# Patient Record
Sex: Female | Born: 2000 | Race: Black or African American | Hispanic: No | Marital: Single | Smoking: Former smoker
Health system: Southern US, Community
[De-identification: ages and names within clinical notes are randomized; demographics above are authoritative.]

## PROBLEM LIST (undated history)

## (undated) DIAGNOSIS — F419 Anxiety disorder, unspecified: Secondary | ICD-10-CM

## (undated) DIAGNOSIS — J45909 Unspecified asthma, uncomplicated: Secondary | ICD-10-CM

## (undated) DIAGNOSIS — T7840XA Allergy, unspecified, initial encounter: Secondary | ICD-10-CM

## (undated) DIAGNOSIS — K219 Gastro-esophageal reflux disease without esophagitis: Secondary | ICD-10-CM

## (undated) DIAGNOSIS — F319 Bipolar disorder, unspecified: Secondary | ICD-10-CM

## (undated) DIAGNOSIS — F32A Depression, unspecified: Secondary | ICD-10-CM

## (undated) DIAGNOSIS — E669 Obesity, unspecified: Secondary | ICD-10-CM

## (undated) DIAGNOSIS — D649 Anemia, unspecified: Secondary | ICD-10-CM

## (undated) HISTORY — DX: Allergy, unspecified, initial encounter: T78.40XA

## (undated) HISTORY — PX: MANDIBLE SURGERY: SHX707

## (undated) HISTORY — DX: Obesity, unspecified: E66.9

## (undated) HISTORY — DX: Gastro-esophageal reflux disease without esophagitis: K21.9

## (undated) HISTORY — DX: Bipolar disorder, unspecified: F31.9

## (undated) HISTORY — DX: Unspecified asthma, uncomplicated: J45.909

## (undated) HISTORY — DX: Anxiety disorder, unspecified: F41.9

## (undated) HISTORY — DX: Anemia, unspecified: D64.9

## (undated) HISTORY — DX: Depression, unspecified: F32.A

---
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
## 2018-03-18 DIAGNOSIS — J4520 Mild intermittent asthma, uncomplicated: Secondary | ICD-10-CM | POA: Insufficient documentation

## 2018-03-18 DIAGNOSIS — F333 Major depressive disorder, recurrent, severe with psychotic symptoms: Secondary | ICD-10-CM | POA: Insufficient documentation

## 2018-05-30 DIAGNOSIS — F418 Other specified anxiety disorders: Secondary | ICD-10-CM | POA: Insufficient documentation

## 2018-09-19 DIAGNOSIS — O99820 Streptococcus B carrier state complicating pregnancy: Secondary | ICD-10-CM | POA: Insufficient documentation

## 2018-10-15 DIAGNOSIS — R12 Heartburn: Secondary | ICD-10-CM | POA: Insufficient documentation

## 2019-01-31 ENCOUNTER — Other Ambulatory Visit: Payer: Self-pay

## 2019-01-31 ENCOUNTER — Encounter: Payer: Self-pay | Admitting: Emergency Medicine

## 2019-01-31 ENCOUNTER — Emergency Department
Admission: EM | Admit: 2019-01-31 | Discharge: 2019-01-31 | Disposition: A | Discharge status: Home / Self Care | Payer: Medicaid Other | Attending: Emergency Medicine | Admitting: Emergency Medicine

## 2019-01-31 ENCOUNTER — Emergency Department: Payer: Medicaid Other

## 2019-01-31 DIAGNOSIS — Z87891 Personal history of nicotine dependence: Secondary | ICD-10-CM | POA: Insufficient documentation

## 2019-01-31 DIAGNOSIS — R42 Dizziness and giddiness: Secondary | ICD-10-CM

## 2019-01-31 DIAGNOSIS — M542 Cervicalgia: Secondary | ICD-10-CM | POA: Diagnosis not present

## 2019-01-31 LAB — CBC
HCT: 39.4 % (ref 36.0–46.0)
Hemoglobin: 11.9 g/dL — ABNORMAL LOW (ref 12.0–15.0)
MCH: 23.1 pg — ABNORMAL LOW (ref 26.0–34.0)
MCHC: 30.2 g/dL (ref 30.0–36.0)
MCV: 76.5 fL — ABNORMAL LOW (ref 80.0–100.0)
Platelets: 354 10*3/uL (ref 150–400)
RBC: 5.15 MIL/uL — ABNORMAL HIGH (ref 3.87–5.11)
RDW: 13.5 % (ref 11.5–15.5)
WBC: 5.4 10*3/uL (ref 4.0–10.5)
nRBC: 0 % (ref 0.0–0.2)

## 2019-01-31 LAB — COMPREHENSIVE METABOLIC PANEL
ALT: 26 U/L (ref 0–44)
AST: 24 U/L (ref 15–41)
Albumin: 4.2 g/dL (ref 3.5–5.0)
Alkaline Phosphatase: 105 U/L (ref 38–126)
Anion gap: 7 (ref 5–15)
BUN: 9 mg/dL (ref 6–20)
CO2: 24 mmol/L (ref 22–32)
Calcium: 9.8 mg/dL (ref 8.9–10.3)
Chloride: 108 mmol/L (ref 98–111)
Creatinine, Ser: 0.66 mg/dL (ref 0.44–1.00)
GFR calc Af Amer: >60 mL/min (ref >60)
GFR calc non Af Amer: >60 mL/min (ref >60)
Glucose, Bld: 108 mg/dL — ABNORMAL HIGH (ref 70–99)
Potassium: 3.7 mmol/L (ref 3.5–5.1)
Sodium: 139 mmol/L (ref 135–145)
Total Bilirubin: 0.6 mg/dL (ref 0.3–1.2)
Total Protein: 7.3 g/dL (ref 6.5–8.1)

## 2019-01-31 MED ORDER — MELOXICAM 15 MG PO TABS: 15.0000 mg | ORAL_TABLET | Freq: Every day | ORAL | 0 refills | Status: DC

## 2019-01-31 NOTE — ED Notes (Signed)
Pt verbalized understanding of discharge instructions. NAD at this time. 

## 2019-01-31 NOTE — ED Provider Notes (Signed)
[facility] Emergency Department Provider Note ____________________________________________   First MD Initiated Contact with Patient 01/31/19 1616     (approximate)  I have reviewed the triage vital signs and the nursing notes.   HISTORY  Chief Complaint Dizziness and Neck Pain  HPI Brittney Kline is a 18 y.o. female who presents the emergency department for treatment and evaluation of intermittent dizziness that has been present for several months.  She has not experienced any near syncope/syncopal episodes.  Her father is a diabetic and she thought maybe she to is a diabetic.  Additionally, she states that she has pain in her neck when she rotates it around. She denies injury.  Symptoms have been present for "a long time."  She states that she can sometimes hear a pop.  This is not necessarily painful every time she moves, but occasionally she can feel a pain in her neck and it radiates down into her shoulders.  No alleviating measures attempted prior to arrival.  No previous exam         History reviewed. No pertinent past medical history.  There are no active problems to display for this patient.   History reviewed. No pertinent surgical history.  Prior to Admission medications   Medication Sig Start Date End Date Taking? Authorizing Provider  meloxicam (MOBIC) 15 MG tablet Take 1 tablet (15 mg total) by mouth daily. 01/31/19   Victorino Dike, FNP    Allergies Patient has no known allergies.  No family history on file.  Social History Social History   Tobacco Use  . Smoking status: Former Research scientist (life sciences)  . Smokeless tobacco: Never Used  Substance Use Topics  . Alcohol use: Not Currently  . Drug use: Not Currently    Review of Systems  Constitutional: No fever/chills.  Positive for intermittent dizziness Eyes: No visual changes. ENT: No sore throat. Cardiovascular: Denies chest pain. Respiratory: Denies shortness of breath.  Gastrointestinal: No abdominal pain.  No nausea, no vomiting.  No diarrhea.  No constipation. Genitourinary: Negative for dysuria. Musculoskeletal: Negative for back pain.  Positive for neck pain Skin: Negative for rash. Neurological: Negative for headaches, focal weakness or numbness. ____________________________________________   PHYSICAL EXAM:  VITAL SIGNS: ED Triage Vitals  Enc Vitals Group     BP 01/31/19 1445 124/71     Pulse Rate 01/31/19 1445 (!) 55     Resp 01/31/19 1445 18     Temp 01/31/19 1445 98.6 F (37 C)     Temp src --      SpO2 01/31/19 1445 100 %     Weight 01/31/19 1446 200 lb (90.7 kg)     Height 01/31/19 1446 5\' 3"  (1.6 m)     Head Circumference --      Peak Flow --      Pain Score 01/31/19 1446 8     Pain Loc --      Pain Edu? --      Excl. in [place]? --     Constitutional: Alert and oriented. Well appearing and in no acute distress. Eyes: Conjunctivae are normal. PERRL. EOMI. Head: Atraumatic. Nose: No congestion/rhinnorhea. Mouth/Throat: Mucous membranes are moist.  Oropharynx non-erythematous. Neck: No stridor.   Hematological/Lymphatic/Immunilogical: No cervical lymphadenopathy. Cardiovascular: Normal rate, regular rhythm. Grossly normal heart sounds.  Good peripheral circulation. Respiratory: Normal respiratory effort.  No retractions. Lungs CTAB. Gastrointestinal: Soft and nontender. No distention. No abdominal bruits. No CVA tenderness. Genitourinary:  Musculoskeletal: No lower extremity tenderness nor edema.  No joint effusions. Neurologic:  Normal speech and language. No gross focal neurologic deficits are appreciated. No gait instability. Skin:  Skin is warm, dry and intact. No rash noted. Psychiatric: Mood and affect are normal. Speech and behavior are normal.  ____________________________________________   LABS (all labs ordered are listed, but only abnormal results are displayed)  Labs Reviewed  COMPREHENSIVE METABOLIC PANEL -  Abnormal; Notable for the following components:      Result Value   Glucose, Bld 108 (*)    All other components within normal limits  CBC - Abnormal; Notable for the following components:   RBC 5.15 (*)    Hemoglobin 11.9 (*)    MCV 76.5 (*)    MCH 23.1 (*)    All other components within normal limits   ____________________________________________  EKG  Not indicated ____________________________________________  RADIOLOGY  ED MD interpretation:    Image of the cervical spine is negative for acute findings per radiology.  Official radiology report(s): Dg Cervical Spine 2-3 Views  Result Date: 01/31/2019 CLINICAL DATA:  Neck pain EXAM: CERVICAL SPINE - 2-3 VIEW COMPARISON:  None. IMPRESSION: Negative cervical spine radiographs. Electronically Signed   By: Charlett NoseKevin  Dover M.D.   On: 01/31/2019 17:13    ____________________________________________   PROCEDURES  Procedure(s) performed (including Critical Care):  Procedures  ____________________________________________   INITIAL IMPRESSION / ASSESSMENT AND PLAN     18 year old female presenting to the emergency department for treatment and evaluation of dizziness and neck pain.  Both are chronic in nature.  She states that the dizziness seems to have worsened over the past 24 hours as well as the neck pain.  ED COURSE  Image of the cervical spine is reassuring.  Lab studies are reassuring as well.  The patient felt reassured by knowing that she is not diabetic.  She states that that was her main concern.  Her neck pain will be treated with meloxicam.  She was advised that if this does not help, she is to follow-up with primary care.  She is to return to the emergency department for symptoms change or worsen or for new concerns if unable to schedule an appointment. ____________________________________________   FINAL CLINICAL IMPRESSION(S) / ED DIAGNOSES  Final diagnoses:  Neck pain  Dizziness     ED Discharge  Orders         Ordered    meloxicam (MOBIC) 15 MG tablet  Daily     01/31/19 1725           Note:  This document was prepared using Dragon voice recognition software and may include unintentional dictation errors.   Chinita Pesterriplett, Rodricus Candelaria B, FNP 01/31/19 1739    Shaune PollackIsaacs, [place], MD 02/01/19 1108

## 2019-01-31 NOTE — ED Notes (Signed)
Pt c/o neck pain, nausea. Pt often feels dizzy. Pt able to walk to room.

## 2019-01-31 NOTE — Discharge Instructions (Signed)
Please take your medication as prescribed.  Follow-up with a primary care provider of your choice if not improving with medication and time.  Return to the emergency department for symptoms of change or worsen if unable to schedule an appointment.

## 2019-01-31 NOTE — ED Triage Notes (Signed)
Pt to ED with c/o of neck pain and dizziness that started 2 days ago. Pt can move neck w/o difficulty but states it "cracks".

## 2019-04-21 DIAGNOSIS — F32A Depression, unspecified: Secondary | ICD-10-CM | POA: Insufficient documentation

## 2019-05-25 ENCOUNTER — Emergency Department: Payer: Medicaid Other

## 2019-05-25 ENCOUNTER — Emergency Department
Admission: EM | Admit: 2019-05-25 | Discharge: 2019-05-25 | Disposition: A | Discharge status: Home / Self Care | Payer: Medicaid Other | Attending: Student in an Organized Health Care Education/Training Program | Admitting: Student in an Organized Health Care Education/Training Program

## 2019-05-25 ENCOUNTER — Other Ambulatory Visit: Payer: Self-pay

## 2019-05-25 DIAGNOSIS — R0789 Other chest pain: Secondary | ICD-10-CM | POA: Diagnosis not present

## 2019-05-25 DIAGNOSIS — Z87891 Personal history of nicotine dependence: Secondary | ICD-10-CM | POA: Insufficient documentation

## 2019-05-25 DIAGNOSIS — M791 Myalgia, unspecified site: Secondary | ICD-10-CM | POA: Insufficient documentation

## 2019-05-25 DIAGNOSIS — R52 Pain, unspecified: Secondary | ICD-10-CM

## 2019-05-25 LAB — BASIC METABOLIC PANEL
Anion gap: 8 (ref 5–15)
BUN: 11 mg/dL (ref 6–20)
CO2: 26 mmol/L (ref 22–32)
Calcium: 9.7 mg/dL (ref 8.9–10.3)
Chloride: 103 mmol/L (ref 98–111)
Creatinine, Ser: 0.69 mg/dL (ref 0.44–1.00)
GFR calc Af Amer: >60 mL/min (ref >60)
GFR calc non Af Amer: >60 mL/min (ref >60)
Glucose, Bld: 82 mg/dL (ref 70–99)
Potassium: 3.5 mmol/L (ref 3.5–5.1)
Sodium: 137 mmol/L (ref 135–145)

## 2019-05-25 LAB — CBC
HCT: 41.1 % (ref 36.0–46.0)
Hemoglobin: 12.6 g/dL (ref 12.0–15.0)
MCH: 23.4 pg — ABNORMAL LOW (ref 26.0–34.0)
MCHC: 30.7 g/dL (ref 30.0–36.0)
MCV: 76.4 fL — ABNORMAL LOW (ref 80.0–100.0)
Platelets: 377 10*3/uL (ref 150–400)
RBC: 5.38 MIL/uL — ABNORMAL HIGH (ref 3.87–5.11)
RDW: 14.7 % (ref 11.5–15.5)
WBC: 10.3 10*3/uL (ref 4.0–10.5)
nRBC: 0 % (ref 0.0–0.2)

## 2019-05-25 LAB — POCT PREGNANCY, URINE: Preg Test, Ur: NEGATIVE

## 2019-05-25 LAB — TROPONIN I (HIGH SENSITIVITY)
Troponin I (High Sensitivity): 2 ng/L (ref <18)
Troponin I (High Sensitivity): <2 ng/L (ref <18)

## 2019-05-25 LAB — FIBRIN DERIVATIVES D-DIMER (ARMC ONLY): Fibrin derivatives D-dimer (ARMC): 454.91 ng/mL (FEU) (ref 0.00–499.00)

## 2019-05-25 MED ORDER — SODIUM CHLORIDE 0.9% FLUSH: 3.0000 mL | Freq: Once | INTRAVENOUS | Status: DC

## 2019-05-25 MED ORDER — NAPROXEN 500 MG PO TABS
500.0000 mg | ORAL_TABLET | Freq: Once | ORAL | Status: AC
  Administered 2019-05-25: 500 mg via ORAL
  Filled 2019-05-25: qty 1

## 2019-05-25 NOTE — ED Provider Notes (Signed)
[facility] Emergency Department Provider Note    First MD Initiated Contact with Patient 05/25/19 1309     (approximate)  I have reviewed the triage vital signs and the nursing notes.   HISTORY  Chief Complaint Headache and Generalized Body Aches    HPI Brittney Kline is a 18 y.o. female   the below listed past medical history presents to the ER for evaluation of several months of muscle aches as well as some shortness of breath pleuritic chest pain and recurrent daily headache.  Patient states she is not had any fevers.  No cough or congestion.  Does have a history of asthma.  Has taken albuterol at home without any change in symptoms.  States she will sometimes have some heartburn symptoms after eating.  Denies any abdominal pain right now.  Does not have a PCP that is why she presented to the ER today.  States she did go off of her Zoloft, Zyprexa, trazodone after delivering her baby 7 months ago and restarted on roughly 3 months ago.  Denies any SI or HI.   History reviewed. No pertinent past medical history. No family history on file. Past Surgical History:  Procedure Laterality Date  . VAGINAL DELIVERY     There are no problems to display for this patient.     Prior to Admission medications   Not on File    Allergies Patient has no known allergies.    Social History Social History   Tobacco Use  . Smoking status: Former Games developer  . Smokeless tobacco: Never Used  Substance Use Topics  . Alcohol use: Not Currently  . Drug use: Not Currently    Review of Systems Patient denies headaches, rhinorrhea, blurry vision, numbness, shortness of breath, chest pain, edema, cough, abdominal pain, nausea, vomiting, diarrhea, dysuria, fevers, rashes or hallucinations unless otherwise stated above in HPI. ____________________________________________   PHYSICAL EXAM:  VITAL SIGNS: Vitals:   05/25/19 1127 05/25/19 1340  BP: 133/79 130/70    Pulse: (!) 50 (!) 57  Resp: 18 16  Temp: 98.5 F (36.9 C)   SpO2: 100% 99%    Constitutional: Alert and oriented. pleasant  Eyes: Conjunctivae are normal.  Head: Atraumatic. Nose: No congestion/rhinnorhea. Mouth/Throat: Mucous membranes are moist.   Neck: No stridor. Painless ROM.  Cardiovascular: Normal rate, regular rhythm. Grossly normal heart sounds.  Good peripheral circulation. Respiratory: Normal respiratory effort.  No retractions. Lungs CTAB. Gastrointestinal: Soft and nontender. No distention. No CVA tenderness. Genitourinary:  Musculoskeletal: No lower extremity tenderness nor edema.  Neurologic:  Normal speech and language. No gross focal neurologic deficits are appreciated. No facial droop Skin:  Skin is warm, dry and intact. No rash noted. Psychiatric: Mood and affect are normal. Speech and behavior are normal.  ____________________________________________   LABS (all labs ordered are listed, but only abnormal results are displayed)  Results for orders placed or performed during the hospital encounter of 05/25/19 (from the past 24 hour(s))  Basic metabolic panel     Status: None   Collection Time: 05/25/19 11:39 AM  Result Value Ref Range   Sodium 137 135 - 145 mmol/L   Potassium 3.5 3.5 - 5.1 mmol/L   Chloride 103 98 - 111 mmol/L   CO2 26 22 - 32 mmol/L   Glucose, Bld 82 70 - 99 mg/dL   BUN 11 6 - 20 mg/dL   Creatinine, Ser 5.62 0.44 - 1.00 mg/dL   Calcium 9.7 8.9 - 13.0 mg/dL  GFR calc non Af Amer >60 >60 mL/min   GFR calc Af Amer >60 >60 mL/min   Anion gap 8 5 - 15  CBC     Status: Abnormal   Collection Time: 05/25/19 11:39 AM  Result Value Ref Range   WBC 10.3 4.0 - 10.5 K/uL   RBC 5.38 (H) 3.87 - 5.11 MIL/uL   Hemoglobin 12.6 12.0 - 15.0 g/dL   HCT 16.141.1 09.636.0 - 04.546.0 %   MCV 76.4 (L) 80.0 - 100.0 fL   MCH 23.4 (L) 26.0 - 34.0 pg   MCHC 30.7 30.0 - 36.0 g/dL   RDW 40.914.7 81.111.5 - 91.415.5 %   Platelets 377 150 - 400 K/uL   nRBC 0.0 0.0 - 0.2 %   Troponin I (High Sensitivity)     Status: None   Collection Time: 05/25/19 11:39 AM  Result Value Ref Range   Troponin I (High Sensitivity) 2 <18 ng/L  Pregnancy, urine POC     Status: None   Collection Time: 05/25/19 12:14 PM  Result Value Ref Range   Preg Test, Ur NEGATIVE NEGATIVE  Troponin I (High Sensitivity)     Status: None   Collection Time: 05/25/19  1:36 PM  Result Value Ref Range   Troponin I (High Sensitivity) <2 <18 ng/L  Fibrin derivatives D-Dimer ([facility] only)     Status: None   Collection Time: 05/25/19  1:36 PM  Result Value Ref Range   Fibrin derivatives D-dimer ([facility]) 454.91 0.00 - 499.00 ng/mL (FEU)   ____________________________________________  EKG My review and personal interpretation at Time: 11:34   Indication: chest pain  Rate: 70  Rhythm: sinus dysrhythmia Axis:  normal Other: normal intervals, no stemi, no depression ____________________________________________  RADIOLOGY  I personally reviewed all radiographic images ordered to evaluate for the above acute complaints and reviewed radiology reports and findings.  These findings were personally discussed with the patient.  Please see medical record for radiology report.  ____________________________________________   PROCEDURES  Procedure(s) performed:  Procedures    Critical Care performed: no ____________________________________________   INITIAL IMPRESSION / ASSESSMENT AND PLAN / ED COURSE  Pertinent labs & imaging results that were available during my care of the patient were reviewed by me and considered in my medical decision making (see chart for details).   DDX: acs, pe, bronchitis, covid 19,deconditioning, pna, ptx, cardiomyopathy  Brittney Kline is a 18 y.o. who presents to the ED with symptoms as described above for the past several months.  Patient exceedingly well-appearing clinically.  No hypoxia.  Not consistent with ACS given normal EKG low risk heart score and negative  troponin after several weeks to months of symptoms.  As she is on birth control and post partum will send D-dimer to further restratify for PE.  Although she has no lower extremity swelling.  Exam reassuring.  Denies any recent covid exposures or sick contacts.   Clinical Course as of May 25 1427  Tue May 25, 2019  1427 Blood work reassuring.  Not consistent with PE or dissection.  Her exam remains reassuring.  She is well-appearing.  At this point do believe she stable and appropriate for outpatient work-up.  Has been given referral to PCP. Have discussed with the patient and available family all diagnostics and treatments performed thus far and all questions were answered to the best of my ability. The patient demonstrates understanding and agreement with plan.    [PR]    Clinical Course User Index [PR] Willy EddyRobinson, Antwian Santaana,  MD    The patient was evaluated in Emergency Department today for the symptoms described in the history of present illness. He/she was evaluated in the context of the global COVID-19 pandemic, which necessitated consideration that the patient might be at risk for infection with the SARS-CoV-2 virus that causes COVID-19. Institutional protocols and algorithms that pertain to the evaluation of patients at risk for COVID-19 are in a state of rapid change based on information released by regulatory bodies including the CDC and federal and state organizations. These policies and algorithms were followed during the patient's care in the ED.  As part of my medical decision making, I reviewed the following data within the [place] notes reviewed and incorporated, Labs reviewed, notes from prior ED visits and [place] Controlled Substance Database   ____________________________________________   FINAL CLINICAL IMPRESSION(S) / ED DIAGNOSES  Final diagnoses:  Body aches  Atypical chest pain      NEW MEDICATIONS STARTED DURING THIS VISIT:  New Prescriptions    No medications on file     Note:  This document was prepared using Dragon voice recognition software and may include unintentional dictation errors.    Merlyn Lot, MD 05/25/19 [phone]

## 2019-05-25 NOTE — ED Triage Notes (Signed)
Pt comes via POV from home with c/o body aches and some headaches. Pt states she had a baby about 7 months ago and has not had any further post baby check ups.  Pt states she has some chest pain. Pt states some SOB also. Pt states it hurts to take in a deep breath. Pt states pressure in her head. Pt also states she has bad anxiety and has had some dizziness.

## 2019-05-25 NOTE — ED Notes (Signed)

## 2019-05-25 NOTE — ED Notes (Signed)
See triage note  Presents with intermittent body aces,h/a and chest pain for about 3 months  Afebrile on arrival

## 2019-06-12 ENCOUNTER — Emergency Department
Admission: EM | Admit: 2019-06-12 | Discharge: 2019-06-12 | Disposition: A | Discharge status: Home / Self Care | Payer: Medicaid Other | Attending: Emergency Medicine | Admitting: Emergency Medicine

## 2019-06-12 ENCOUNTER — Other Ambulatory Visit: Payer: Self-pay

## 2019-06-12 ENCOUNTER — Encounter: Payer: Self-pay | Admitting: Emergency Medicine

## 2019-06-12 DIAGNOSIS — G44209 Tension-type headache, unspecified, not intractable: Secondary | ICD-10-CM | POA: Diagnosis not present

## 2019-06-12 DIAGNOSIS — Z87891 Personal history of nicotine dependence: Secondary | ICD-10-CM | POA: Diagnosis not present

## 2019-06-12 DIAGNOSIS — Z20822 Contact with and (suspected) exposure to covid-19: Secondary | ICD-10-CM | POA: Insufficient documentation

## 2019-06-12 DIAGNOSIS — R519 Headache, unspecified: Secondary | ICD-10-CM | POA: Diagnosis present

## 2019-06-12 LAB — SARS CORONAVIRUS 2 (TAT 6-24 HRS): SARS Coronavirus 2: NEGATIVE

## 2019-06-12 MED ORDER — BACLOFEN 10 MG PO TABS: 10.0000 mg | ORAL_TABLET | Freq: Every day | ORAL | 1 refills | Status: AC

## 2019-06-12 NOTE — ED Triage Notes (Signed)
States headache x 2 days. Denies head injury.

## 2019-06-12 NOTE — Discharge Instructions (Signed)
Continue to take Aleve or Tylenol for a headache.  Also take the baclofen for the muscle spasms in the back of your shoulders and neck which should help your headache.  Your Covid test should result in 6 to 24 hours.  If negative he may return to work next day.  If positive you need to quarantine for an additional 10 days.

## 2019-06-12 NOTE — ED Provider Notes (Signed)
[facility] Emergency Department Provider Note  ____________________________________________   First MD Initiated Contact with Patient 06/12/19 1422     (approximate)  I have reviewed the triage vital signs and the nursing notes.   HISTORY  Chief Complaint Headache    HPI Brittney Kline is a 19 y.o. female presents emergency department complaining of a headache.  States headache for 2 days.  States that she did travel out of town and now her employer is concerned she might have Covid due to the headache.  States headache starts from the spasms in her upper back into her neck and to the sides of her head.  No vomiting.  No photophobia.  No sensitivity to noise.  No cough or congestion.  No fever or chills.    History reviewed. No pertinent past medical history.  There are no problems to display for this patient.   Past Surgical History:  Procedure Laterality Date  . VAGINAL DELIVERY      Prior to Admission medications   Medication Sig Start Date End Date Taking? Authorizing Provider  baclofen (LIORESAL) 10 MG tablet Take 1 tablet (10 mg total) by mouth daily. 06/12/19 06/11/20  Faythe Ghee, PA-C    Allergies Patient has no known allergies.  History reviewed. No pertinent family history.  Social History Social History   Tobacco Use  . Smoking status: Former Games developer  . Smokeless tobacco: Never Used  Substance Use Topics  . Alcohol use: Not Currently  . Drug use: Not Currently    Review of Systems  Constitutional: No fever/chills Eyes: No visual changes. ENT: No sore throat. Respiratory: Denies cough Cardiovascular: Denies chest pain Genitourinary: Negative for dysuria. Musculoskeletal: Negative for back pain. Skin: Negative for rash.    ____________________________________________   PHYSICAL EXAM:  VITAL SIGNS: ED Triage Vitals  Enc Vitals Group     BP 06/12/19 1228 131/85     Pulse Rate 06/12/19 1228 (!) 56     Resp  06/12/19 1228 18     Temp 06/12/19 1228 97.9 F (36.6 C)     Temp Source 06/12/19 1228 Oral     SpO2 06/12/19 1228 100 %     Weight 06/12/19 1230 198 lb (89.8 kg)     Height 06/12/19 1230 5\' 4"  (1.626 m)     Head Circumference --      Peak Flow --      Pain Score --      Pain Loc --      Pain Edu? --      Excl. in [place]? --     Constitutional: Alert and oriented. Well appearing and in no acute distress. Eyes: Conjunctivae are normal.  Head: Atraumatic. Nose: No congestion/rhinnorhea. Mouth/Throat: Mucous membranes are moist.   Neck:  supple no lymphadenopathy noted Cardiovascular: Normal rate, regular rhythm. Heart sounds are normal Respiratory: Normal respiratory effort.  No retractions, lungs c t a  GU: deferred Musculoskeletal: FROM all extremities, warm and well perfused Neurologic:  Normal speech and language.  Cranial nerves II through XII grossly intact Skin:  Skin is warm, dry and intact. No rash noted. Psychiatric: Mood and affect are normal. Speech and behavior are normal.  ____________________________________________   LABS (all labs ordered are listed, but only abnormal results are displayed)  Labs Reviewed - No data to display ____________________________________________   ____________________________________________  RADIOLOGY    ____________________________________________   PROCEDURES  Procedure(s) performed: No  Procedures    ____________________________________________   INITIAL IMPRESSION /  ASSESSMENT AND PLAN / ED COURSE  Pertinent labs & imaging results that were available during my care of the patient were reviewed by me and considered in my medical decision making (see chart for details).   The patient is a 19 year old female presents emergency department complaint of a headache.  See HPI  Physical exam shows patient to appear well.  She is very talkative.  She is not sensitive to light.  Remainder of exam is  unremarkable.  Explained findings to the patient.  Due to her Covid concerns Covid test was ordered.  She was also given a muscle relaxer for the tension type headache.  She was given a work note stating she can return to work if her Covid test is negative.  She is to return emergency department worsening.  States she understands will comply patient discharged stable condition.    Brittney Kline was evaluated in Emergency Department on 06/12/2019 for the symptoms described in the history of present illness. She was evaluated in the context of the global COVID-19 pandemic, which necessitated consideration that the patient might be at risk for infection with the SARS-CoV-2 virus that causes COVID-19. Institutional protocols and algorithms that pertain to the evaluation of patients at risk for COVID-19 are in a state of rapid change based on information released by regulatory bodies including the CDC and federal and state organizations. These policies and algorithms were followed during the patient's care in the ED.   As part of my medical decision making, I reviewed the following data within the [place] notes reviewed and incorporated, Old chart reviewed, Notes from prior ED visits and [place] Controlled Substance Database  ____________________________________________   FINAL CLINICAL IMPRESSION(S) / ED DIAGNOSES  Final diagnoses:  Tension headache  Suspected COVID-19 virus infection      NEW MEDICATIONS STARTED DURING THIS VISIT:  New Prescriptions   BACLOFEN (LIORESAL) 10 MG TABLET    Take 1 tablet (10 mg total) by mouth daily.     Note:  This document was prepared using Dragon voice recognition software and may include unintentional dictation errors.    Versie Starks, PA-C 06/12/19 1602    Earleen Newport, MD 06/12/19 [phone]

## 2019-06-12 NOTE — ED Notes (Signed)
Esign not available pt verbalized discharge instructions and has no questions at this time. 

## 2019-06-30 ENCOUNTER — Emergency Department
Admission: EM | Admit: 2019-06-30 | Discharge: 2019-06-30 | Disposition: A | Discharge status: Home / Self Care | Payer: Medicaid Other | Attending: Emergency Medicine | Admitting: Emergency Medicine

## 2019-06-30 ENCOUNTER — Encounter: Payer: Self-pay | Admitting: Emergency Medicine

## 2019-06-30 ENCOUNTER — Other Ambulatory Visit: Payer: Self-pay

## 2019-06-30 DIAGNOSIS — R590 Localized enlarged lymph nodes: Secondary | ICD-10-CM | POA: Diagnosis not present

## 2019-06-30 DIAGNOSIS — M542 Cervicalgia: Secondary | ICD-10-CM

## 2019-06-30 MED ORDER — MELOXICAM 7.5 MG PO TABS
15.0000 mg | ORAL_TABLET | Freq: Every day | ORAL | Status: DC
  Administered 2019-06-30: 15 mg via ORAL
  Filled 2019-06-30: qty 2

## 2019-06-30 MED ORDER — MELOXICAM 15 MG PO TABS: 15.0000 mg | ORAL_TABLET | Freq: Every day | ORAL | 0 refills | Status: DC

## 2019-06-30 NOTE — ED Triage Notes (Signed)
Pt states pain to her left side throat when she presses and she feels like there is a knot there for 3 days. Pt reports sometimes feels nauseated to the point that she can not keep her head up. Pt reports this has been going on for several months. Pt reports also has HA and neck pain sometimes which has been going on for several months. Pt states she comes here all the time and she wants to know what is going on with her.

## 2019-06-30 NOTE — ED Provider Notes (Signed)
[facility] Emergency Department Provider Note ____________________________________________   First MD Initiated Contact with Patient 06/30/19 2014     (approximate)  I have reviewed the triage vital signs and the nursing notes.   HISTORY  Chief Complaint No chief complaint on file.  HPI Brittney Kline is a 19 y.o. female who presents to the emergency department for treatment and evaluation of multiple medical complaints.  She states that over the past 3 days, she has had a "knot" on the left side of her neck that she can feel when she presses in.  She states the area has burned."  She denies sore throat or fever.  She also states that for a "long time" she has had neck pain, headache, and bilateral arm pain.  She denies injury.  She states that she was prescribed meloxicam for this at one of her visits here but was afraid that "it would raise my potassium" and therefore she did not take it and threw it away.  She states that she is also attempted taking muscle relaxers for her neck pain without relief.  She also complains of intermittent nausea.  She has not missed her menstrual cycle and does not believe that she is pregnant.         History reviewed. No pertinent past medical history.  There are no problems to display for this patient.   Past Surgical History:  Procedure Laterality Date  . VAGINAL DELIVERY      Prior to Admission medications   Medication Sig Start Date End Date Taking? Authorizing Provider  baclofen (LIORESAL) 10 MG tablet Take 1 tablet (10 mg total) by mouth daily. 06/12/19 06/11/20  Fisher, Roselyn Bering, PA-C  meloxicam (MOBIC) 15 MG tablet Take 1 tablet (15 mg total) by mouth daily. 06/30/19   Chinita Pester, FNP    Allergies Patient has no known allergies.  No family history on file.  Social History Social History   Tobacco Use  . Smoking status: Former Games developer  . Smokeless tobacco: Never Used  Substance Use Topics  . Alcohol  use: Not Currently  . Drug use: Not Currently    Review of Systems  Constitutional: No fever/chills Eyes: No visual changes. ENT: No sore throat.  Positive for sense of a "knot" in the left inferior mandible area Cardiovascular: Denies chest pain. Respiratory: Denies shortness of breath. Gastrointestinal: No abdominal pain.  No nausea, no vomiting.  No diarrhea.  No constipation. Genitourinary: Negative for dysuria. Musculoskeletal: Positive for neck pain and bilateral arm pain. Skin: Negative for rash. Neurological: Negative for headaches, focal weakness or numbness. ____________________________________________   PHYSICAL EXAM:  VITAL SIGNS: ED Triage Vitals [06/30/19 1815]  Enc Vitals Group     BP      Pulse      Resp      Temp      Temp src      SpO2      Weight 198 lb (89.8 kg)     Height 5\' 4"  (1.626 m)     Head Circumference      Peak Flow      Pain Score 7     Pain Loc      Pain Edu?      Excl. in GC?     Constitutional: Alert and oriented. Well appearing and in no acute distress. Eyes: Conjunctivae are normal. PERRL. EOMI. Head: Atraumatic. Nose: No congestion/rhinnorhea. Mouth/Throat: Mucous membranes are moist.  Oropharynx non-erythematous. Neck: No stridor.  Small, tender lymph node present in the anterior cervical chain Hematological/Lymphatic/Immunilogical: No cervical lymphadenopathy. Cardiovascular: Normal rate, regular rhythm. Grossly normal heart sounds.  Good peripheral circulation. Respiratory: Normal respiratory effort.  No retractions. Lungs CTAB. Gastrointestinal: Soft and nontender. No distention. No abdominal bruits. No CVA tenderness. Musculoskeletal: No focal midline tenderness over the cervical spine.  Patient demonstrates full, active range of motion of bilateral upper extremities. Neurologic:  Normal speech and language. No gross focal neurologic deficits are appreciated. No gait instability. Skin:  Skin is warm, dry and intact. No rash  noted. Psychiatric: Mood and affect are normal. Speech and behavior are normal.  ____________________________________________   LABS (all labs ordered are listed, but only abnormal results are displayed)  Labs Reviewed - No data to display ____________________________________________  EKG  Not indicated ____________________________________________  RADIOLOGY  ED MD interpretation:    Not indicated  Official radiology report(s): No results found.  ____________________________________________   PROCEDURES  Procedure(s) performed (including Critical Care):  Procedures  ____________________________________________   INITIAL IMPRESSION / ASSESSMENT AND PLAN     19 year old female presenting to the emergency department for treatment and evaluation of multiple medical complaints as listed in the HPI.  Based on her reassuring assessment and exam, she will be prescribed meloxicam.  She was advised that this is not known to cause a drastic increase in potassium.  She will be discharged home and advised to follow-up with the primary care provider for choice for symptoms that are not improving.  DIFFERENTIAL DIAGNOSIS  Differential diagnosis includes but is not limited to: Lymphadenopathy, chronic neck pain, cervical radiculopathy ____________________________________________   FINAL CLINICAL IMPRESSION(S) / ED DIAGNOSES  Final diagnoses:  Lymphadenopathy of left cervical region  Neck pain     ED Discharge Orders         Ordered    meloxicam (MOBIC) 15 MG tablet  Daily     06/30/19 2114           Note:  This document was prepared using Dragon voice recognition software and may include unintentional dictation errors.   Victorino Dike, FNP 06/30/19 2319    Blake Divine, MD 06/30/19 2350

## 2019-07-16 DIAGNOSIS — Z975 Presence of (intrauterine) contraceptive device: Secondary | ICD-10-CM | POA: Insufficient documentation

## 2019-10-19 DIAGNOSIS — Z91013 Allergy to seafood: Secondary | ICD-10-CM | POA: Insufficient documentation

## 2020-02-19 ENCOUNTER — Other Ambulatory Visit: Payer: Self-pay

## 2020-02-19 ENCOUNTER — Emergency Department
Admission: EM | Admit: 2020-02-19 | Discharge: 2020-02-19 | Disposition: A | Discharge status: Home / Self Care | Payer: Medicare Other | Attending: Emergency Medicine | Admitting: Emergency Medicine

## 2020-02-19 DIAGNOSIS — Z79899 Other long term (current) drug therapy: Secondary | ICD-10-CM | POA: Diagnosis not present

## 2020-02-19 DIAGNOSIS — K8050 Calculus of bile duct without cholangitis or cholecystitis without obstruction: Secondary | ICD-10-CM | POA: Diagnosis not present

## 2020-02-19 DIAGNOSIS — Z20822 Contact with and (suspected) exposure to covid-19: Secondary | ICD-10-CM | POA: Insufficient documentation

## 2020-02-19 DIAGNOSIS — Z87891 Personal history of nicotine dependence: Secondary | ICD-10-CM | POA: Diagnosis not present

## 2020-02-19 DIAGNOSIS — R1010 Upper abdominal pain, unspecified: Secondary | ICD-10-CM | POA: Diagnosis present

## 2020-02-19 LAB — CBC
HCT: 37.7 % (ref 36.0–46.0)
Hemoglobin: 11.9 g/dL — ABNORMAL LOW (ref 12.0–15.0)
MCH: 24.1 pg — ABNORMAL LOW (ref 26.0–34.0)
MCHC: 31.6 g/dL (ref 30.0–36.0)
MCV: 76.3 fL — ABNORMAL LOW (ref 80.0–100.0)
Platelets: 316 10*3/uL (ref 150–400)
RBC: 4.94 MIL/uL (ref 3.87–5.11)
RDW: 13.7 % (ref 11.5–15.5)
WBC: 5.5 10*3/uL (ref 4.0–10.5)
nRBC: 0 % (ref 0.0–0.2)

## 2020-02-19 LAB — COMPREHENSIVE METABOLIC PANEL
ALT: 23 U/L (ref 0–44)
AST: 21 U/L (ref 15–41)
Albumin: 4 g/dL (ref 3.5–5.0)
Alkaline Phosphatase: 97 U/L (ref 38–126)
Anion gap: 8 (ref 5–15)
BUN: 11 mg/dL (ref 6–20)
CO2: 25 mmol/L (ref 22–32)
Calcium: 9.4 mg/dL (ref 8.9–10.3)
Chloride: 106 mmol/L (ref 98–111)
Creatinine, Ser: 0.78 mg/dL (ref 0.44–1.00)
GFR calc Af Amer: >60 mL/min (ref >60)
GFR calc non Af Amer: >60 mL/min (ref >60)
Glucose, Bld: 87 mg/dL (ref 70–99)
Potassium: 3.7 mmol/L (ref 3.5–5.1)
Sodium: 139 mmol/L (ref 135–145)
Total Bilirubin: 1 mg/dL (ref 0.3–1.2)
Total Protein: 7.2 g/dL (ref 6.5–8.1)

## 2020-02-19 LAB — LIPASE, BLOOD: Lipase: 45 U/L (ref 11–51)

## 2020-02-19 LAB — SARS CORONAVIRUS 2 BY RT PCR (HOSPITAL ORDER, PERFORMED IN ~~LOC~~ HOSPITAL LAB): SARS Coronavirus 2: NEGATIVE

## 2020-02-19 LAB — URINALYSIS, COMPLETE (UACMP) WITH MICROSCOPIC
Bacteria, UA: NONE SEEN
Bilirubin Urine: NEGATIVE
Glucose, UA: NEGATIVE mg/dL
Hgb urine dipstick: NEGATIVE
Ketones, ur: NEGATIVE mg/dL
Leukocytes,Ua: NEGATIVE
Nitrite: NEGATIVE
Protein, ur: NEGATIVE mg/dL
Specific Gravity, Urine: 1.028 (ref 1.005–1.030)
pH: 6 (ref 5.0–8.0)

## 2020-02-19 LAB — POCT PREGNANCY, URINE: Preg Test, Ur: NEGATIVE

## 2020-02-19 MED ORDER — HYOSCYAMINE SULFATE SL 0.125 MG SL SUBL: 0.1250 mg | SUBLINGUAL_TABLET | Freq: Three times a day (TID) | SUBLINGUAL | 0 refills | Status: DC | PRN

## 2020-02-19 NOTE — ED Triage Notes (Signed)
Pt states she has been having upper abdominal pain "for a long time" with burping and lighteheadedness- pt states she was given medication for it but lost the medicine- pt states she also wants to get tested for covid since her sister tested positive 2 weeks ago and she has a baby at home- pt states her throat has been itchy

## 2020-02-19 NOTE — ED Provider Notes (Signed)
[facility] Emergency Department Provider Note ____________________________________________   First MD Initiated Contact with Patient 02/19/20 1242     (approximate)  I have reviewed the triage vital signs and the nursing notes.   HISTORY  Chief Complaint Abdominal Pain  HPI Brittney Kline is a 19 y.o. female with no chronic medical history presents to the emergency department for treatment and evaluation of scratchy throat.  Patient also states that she has intermittent upper abdominal pain with burping, bloating, and nausea for a couple of months.  She had some medication, but lost it.  She states it was helping when she took it.  She denies fever, nausea, vomiting, diarrhea, increase in frequency of abdominal pain or change in location.         History reviewed. No pertinent past medical history.  There are no problems to display for this patient.   Past Surgical History:  Procedure Laterality Date  . VAGINAL DELIVERY      Prior to Admission medications   Medication Sig Start Date End Date Taking? Authorizing Provider  baclofen (LIORESAL) 10 MG tablet Take 1 tablet (10 mg total) by mouth daily. 06/12/19 06/11/20  Fisher, Roselyn Bering, PA-C  Hyoscyamine Sulfate SL (LEVSIN/SL) 0.125 MG SUBL Place 0.125 mg under the tongue 3 (three) times daily as needed. 02/19/20   Dalores Weger, Rulon Eisenmenger B, FNP  meloxicam (MOBIC) 15 MG tablet Take 1 tablet (15 mg total) by mouth daily. 06/30/19   Chinita Pester, FNP    Allergies Shrimp [shellfish allergy]  History reviewed. No pertinent family history.  Social History Social History   Tobacco Use  . Smoking status: Former Games developer  . Smokeless tobacco: Never Used  Substance Use Topics  . Alcohol use: Not Currently  . Drug use: Not Currently    Review of Systems  Constitutional: No fever/chills Eyes: No visual changes. ENT: Positive for sore throat. Cardiovascular: Denies chest pain. Respiratory: Denies shortness of  breath. Gastrointestinal: Positive for intermittent abdominal pain.  No nausea, no vomiting.  No diarrhea.  No constipation. Genitourinary: Negative for dysuria. Musculoskeletal: Negative for back pain. Skin: Negative for rash. Neurological: Negative for headaches, focal weakness or numbness. ____________________________________________   PHYSICAL EXAM:  VITAL SIGNS: ED Triage Vitals  Enc Vitals Group     BP 02/19/20 1142 136/79     Pulse Rate 02/19/20 1142 82     Resp 02/19/20 1142 18     Temp 02/19/20 1142 98.2 F (36.8 C)     Temp Source 02/19/20 1142 Oral     SpO2 02/19/20 1142 100 %     Weight 02/19/20 1143 180 lb (81.6 kg)     Height 02/19/20 1143 5\' 4"  (1.626 m)     Head Circumference --      Peak Flow --      Pain Score 02/19/20 1149 0     Pain Loc --      Pain Edu? --      Excl. in GC? --     Constitutional: Alert and oriented. Well appearing and in no acute distress. Eyes: Conjunctivae are normal.  Head: Atraumatic. Nose: No congestion/rhinnorhea. Mouth/Throat: Mucous membranes are moist.  Oropharynx non-erythematous. Neck: No stridor.   Hematological/Lymphatic/Immunilogical: No cervical lymphadenopathy. Cardiovascular: Normal rate, regular rhythm. Grossly normal heart sounds.  Good peripheral circulation. Respiratory: Normal respiratory effort.  No retractions. Lungs CTAB. Gastrointestinal: Soft and nontender. No distention. No abdominal bruits. No CVA tenderness. Genitourinary:  Musculoskeletal: No lower extremity tenderness nor edema.  No joint effusions. Neurologic:  Normal speech and language. No gross focal neurologic deficits are appreciated. No gait instability. Skin:  Skin is warm, dry and intact. No rash noted. Psychiatric: Mood and affect are normal. Speech and behavior are normal.  ____________________________________________   LABS (all labs ordered are listed, but only abnormal results are displayed)  Labs Reviewed  CBC - Abnormal; Notable  for the following components:      Result Value   Hemoglobin 11.9 (*)    MCV 76.3 (*)    MCH 24.1 (*)    All other components within normal limits  URINALYSIS, COMPLETE (UACMP) WITH MICROSCOPIC - Abnormal; Notable for the following components:   Color, Urine YELLOW (*)    APPearance HAZY (*)    All other components within normal limits  SARS CORONAVIRUS 2 BY RT PCR (HOSPITAL ORDER, PERFORMED IN [facility])  LIPASE, BLOOD  COMPREHENSIVE METABOLIC PANEL  POC URINE PREG, ED  POCT PREGNANCY, URINE   ____________________________________________  EKG  Not indicated ____________________________________________  RADIOLOGY  ED MD interpretation:    Not indicated  I, Decker Cogdell, personally viewed and evaluated these images (plain radiographs) as part of my medical decision making, as well as reviewing the written report by the radiologist.  Official radiology report(s): No results found.  ____________________________________________   PROCEDURES  Procedure(s) performed (including Critical Care):  Procedures  ____________________________________________   INITIAL IMPRESSION / ASSESSMENT AND PLAN     19 year old female presents to the emergency department for Covid testing as well as requesting medication refill for intermittent abdominal pain.  See HPI for further details.  DIFFERENTIAL DIAGNOSIS  Acute cholecystitis, IBS, GERD URI, COVID-19, influenza  ED COURSE  Labs are all reassuring.  Urinalysis is reassuring.  Plan will be to prescribe Levsin for the intermittent abdominal pain and collect a COVID-19 screening.  Patient was advised to follow-up with GI for primary care if her abdominal pain becomes worse or consistent.  She was encouraged to make diet changes as well.  For any symptom that changes or worsens if she is unable to see primary care she is to return to the emergency department.     ___________________________________________   FINAL CLINICAL IMPRESSION(S) / ED DIAGNOSES  Final diagnoses:  Biliary colic  Encounter for laboratory testing for COVID-19 virus     ED Discharge Orders         Ordered    Hyoscyamine Sulfate SL (LEVSIN/SL) 0.125 MG SUBL  3 times daily PRN        02/19/20 1251           Brittney Kline was evaluated in Emergency Department on 02/20/2020 for the symptoms described in the history of present illness. She was evaluated in the context of the global COVID-19 pandemic, which necessitated consideration that the patient might be at risk for infection with the SARS-CoV-2 virus that causes COVID-19. Institutional protocols and algorithms that pertain to the evaluation of patients at risk for COVID-19 are in a state of rapid change based on information released by regulatory bodies including the CDC and federal and state organizations. These policies and algorithms were followed during the patient's care in the ED.   Note:  This document was prepared using Dragon voice recognition software and may include unintentional dictation errors.   Chinita Pester, FNP 02/20/20 2053    Dionne Bucy, MD 02/21/20 [id number]

## 2020-02-19 NOTE — ED Notes (Addendum)
Pt c/o upper abdominal pain, burping, bloating, and nausea for approximately 2 months. Pt also requesting COVID test. Pt states that she was dx with GERD and prescribed medications but she "did not take them as prescribed." Pt is AOX4, NAD noted. Pt sitting in chair with cell phone.

## 2020-03-01 ENCOUNTER — Emergency Department
Admission: EM | Admit: 2020-03-01 | Discharge: 2020-03-01 | Disposition: A | Discharge status: Home / Self Care | Payer: Medicare Other | Attending: Emergency Medicine | Admitting: Emergency Medicine

## 2020-03-01 ENCOUNTER — Emergency Department: Payer: Medicare Other

## 2020-03-01 ENCOUNTER — Other Ambulatory Visit: Payer: Self-pay

## 2020-03-01 ENCOUNTER — Encounter: Payer: Self-pay | Admitting: Emergency Medicine

## 2020-03-01 DIAGNOSIS — M79622 Pain in left upper arm: Secondary | ICD-10-CM | POA: Insufficient documentation

## 2020-03-01 DIAGNOSIS — N644 Mastodynia: Secondary | ICD-10-CM | POA: Insufficient documentation

## 2020-03-01 DIAGNOSIS — R079 Chest pain, unspecified: Secondary | ICD-10-CM | POA: Insufficient documentation

## 2020-03-01 DIAGNOSIS — Z5321 Procedure and treatment not carried out due to patient leaving prior to being seen by health care provider: Secondary | ICD-10-CM | POA: Diagnosis not present

## 2020-03-01 DIAGNOSIS — M79621 Pain in right upper arm: Secondary | ICD-10-CM | POA: Diagnosis not present

## 2020-03-01 DIAGNOSIS — R0981 Nasal congestion: Secondary | ICD-10-CM | POA: Diagnosis not present

## 2020-03-01 LAB — CBC
HCT: 35.4 % — ABNORMAL LOW (ref 36.0–46.0)
Hemoglobin: 11.6 g/dL — ABNORMAL LOW (ref 12.0–15.0)
MCH: 24.5 pg — ABNORMAL LOW (ref 26.0–34.0)
MCHC: 32.8 g/dL (ref 30.0–36.0)
MCV: 74.8 fL — ABNORMAL LOW (ref 80.0–100.0)
Platelets: 365 10*3/uL (ref 150–400)
RBC: 4.73 MIL/uL (ref 3.87–5.11)
RDW: 13.6 % (ref 11.5–15.5)
WBC: 5.6 10*3/uL (ref 4.0–10.5)
nRBC: 0 % (ref 0.0–0.2)

## 2020-03-01 LAB — BASIC METABOLIC PANEL
Anion gap: 10 (ref 5–15)
BUN: 11 mg/dL (ref 6–20)
CO2: 21 mmol/L — ABNORMAL LOW (ref 22–32)
Calcium: 9.3 mg/dL (ref 8.9–10.3)
Chloride: 108 mmol/L (ref 98–111)
Creatinine, Ser: 0.62 mg/dL (ref 0.44–1.00)
GFR calc Af Amer: >60 mL/min (ref >60)
GFR calc non Af Amer: >60 mL/min (ref >60)
Glucose, Bld: 105 mg/dL — ABNORMAL HIGH (ref 70–99)
Potassium: 3.7 mmol/L (ref 3.5–5.1)
Sodium: 139 mmol/L (ref 135–145)

## 2020-03-01 LAB — POCT PREGNANCY, URINE: Preg Test, Ur: NEGATIVE

## 2020-03-01 LAB — TROPONIN I (HIGH SENSITIVITY): Troponin I (High Sensitivity): <2 ng/L (ref <18)

## 2020-03-01 NOTE — ED Triage Notes (Signed)
Pt reports CP and left breast and nipple pain. Pt states "sometime there is a shooting pain radiating from nipple inward. Pt states symptoms started 2 weeks ago. Pt c/o bilateral upper arm pain. NAD noted at this tme

## 2020-03-12 ENCOUNTER — Emergency Department
Admission: EM | Admit: 2020-03-12 | Discharge: 2020-03-12 | Disposition: A | Discharge status: Home / Self Care | Payer: Medicare Other | Attending: Emergency Medicine | Admitting: Emergency Medicine

## 2020-03-12 ENCOUNTER — Other Ambulatory Visit: Payer: Self-pay

## 2020-03-12 DIAGNOSIS — F17200 Nicotine dependence, unspecified, uncomplicated: Secondary | ICD-10-CM | POA: Diagnosis not present

## 2020-03-12 DIAGNOSIS — R0981 Nasal congestion: Secondary | ICD-10-CM | POA: Diagnosis not present

## 2020-03-12 DIAGNOSIS — Z20822 Contact with and (suspected) exposure to covid-19: Secondary | ICD-10-CM | POA: Diagnosis not present

## 2020-03-12 LAB — RESPIRATORY PANEL BY RT PCR (FLU A&B, COVID)
Influenza A by PCR: NEGATIVE
Influenza B by PCR: NEGATIVE
SARS Coronavirus 2 by RT PCR: NEGATIVE

## 2020-03-12 NOTE — Discharge Instructions (Signed)
Your Covid test will result in approximately 3 hours. If it is positive someone will call you with the results. If you have not heard from [place] by noon tomorrow you can call the ED to get a copy of your Covid test for work. If your test is positive, take over-the-counter Mucinex, zinc, and vitamin C. Tylenol and ibuprofen for fever and body aches as needed.

## 2020-03-12 NOTE — ED Triage Notes (Signed)
Patient reports congestion and having to take really deep breathes.  Reports she has been around family members suspected of covid and concerned she might have it.  States that her work will not let her return until she has a covid test.

## 2020-03-12 NOTE — ED Provider Notes (Signed)
[facility] Emergency Department Provider Note  ____________________________________________   First MD Initiated Contact with Patient 03/12/20 2114     (approximate)  I have reviewed the triage vital signs and the nursing notes.   HISTORY  Chief Complaint Nasal Congestion    HPI Brittney Kline is a 19 y.o. female presents emergency department complaining of congestion and having to take deep breaths frequently. Family members are suspected to have Covid. She is concerned she might have it and her work is requiring a test before she returns to work. No chest pain/shortness of breath. No vomiting or diarrhea. Symptoms for approximately 4 days    No past medical history on file.  There are no problems to display for this patient.   Past Surgical History:  Procedure Laterality Date   VAGINAL DELIVERY      Prior to Admission medications   Medication Sig Start Date End Date Taking? Authorizing Provider  baclofen (LIORESAL) 10 MG tablet Take 1 tablet (10 mg total) by mouth daily. 06/12/19 06/11/20  Samai Corea, Roselyn Bering, PA-C  Hyoscyamine Sulfate SL (LEVSIN/SL) 0.125 MG SUBL Place 0.125 mg under the tongue 3 (three) times daily as needed. 02/19/20   Triplett, Rulon Eisenmenger B, FNP  meloxicam (MOBIC) 15 MG tablet Take 1 tablet (15 mg total) by mouth daily. 06/30/19   Chinita Pester, FNP    Allergies Shrimp [shellfish allergy]  No family history on file.  Social History Social History   Tobacco Use   Smoking status: Current Some Day Smoker   Smokeless tobacco: Never Used  Substance Use Topics   Alcohol use: Not Currently   Drug use: Yes    Types: Marijuana    Review of Systems  Constitutional: No fever/chills Eyes: No visual changes. ENT: No sore throat. Respiratory: Positive cough Cardiovascular: Denies chest pain Gastrointestinal: Denies abdominal pain Genitourinary: Negative for dysuria. Musculoskeletal: Negative for back pain. Skin: Negative for  rash. Psychiatric: no mood changes,     ____________________________________________   PHYSICAL EXAM:  VITAL SIGNS: ED Triage Vitals  Enc Vitals Group     BP 03/12/20 1957 126/80     Pulse Rate 03/12/20 1957 89     Resp 03/12/20 1957 18     Temp 03/12/20 1957 98.2 F (36.8 C)     Temp Source 03/12/20 1957 Oral     SpO2 03/12/20 1957 100 %     Weight 03/12/20 1954 195 lb (88.5 kg)     Height 03/12/20 1954 5\' 4"  (1.626 m)     Head Circumference --      Peak Flow --      Pain Score 03/12/20 1954 0     Pain Loc --      Pain Edu? --      Excl. in GC? --     Constitutional: Alert and oriented. Well appearing and in no acute distress. Eyes: Conjunctivae are normal.  Head: Atraumatic. Nose: No congestion/rhinnorhea. Mouth/Throat: Mucous membranes are moist.   Neck:  supple no lymphadenopathy noted Cardiovascular: Normal rate, regular rhythm. Heart sounds are normal Respiratory: Normal respiratory effort.  No retractions, lungs c t a  GU: deferred Musculoskeletal: FROM all extremities, warm and well perfused Neurologic:  Normal speech and language.  Skin:  Skin is warm, dry and intact. No rash noted. Psychiatric: Mood and affect are normal. Speech and behavior are normal.  ____________________________________________   LABS (all labs ordered are listed, but only abnormal results are displayed)  Labs Reviewed  RESPIRATORY PANEL  BY RT PCR (FLU A&B, COVID)   ____________________________________________   ____________________________________________  RADIOLOGY    ____________________________________________   PROCEDURES  Procedure(s) performed: No  Procedures    ____________________________________________   INITIAL IMPRESSION / ASSESSMENT AND PLAN / ED COURSE  Pertinent labs & imaging results that were available during my care of the patient were reviewed by me and considered in my medical decision making (see chart for details).   Patient is  19 year old female presents emergency department with concerns for Covid. See HPI. Physical exam is unremarkable. Patient will be tested for Covid and discharged. Explained to her that we could call her with her test results. She was given a work note stating that she should be quarantined until her test results. If positive she will need to remain out of work for 14 days from onset of symptoms. Otherwise she may return to work next day. She is to take over-the-counter medications for her symptoms. Return emergency department if worsening. Discharged in stable condition.     Brittney Kline was evaluated in Emergency Department on 03/12/2020 for the symptoms described in the history of present illness. She was evaluated in the context of the global COVID-19 pandemic, which necessitated consideration that the patient might be at risk for infection with the SARS-CoV-2 virus that causes COVID-19. Institutional protocols and algorithms that pertain to the evaluation of patients at risk for COVID-19 are in a state of rapid change based on information released by regulatory bodies including the CDC and federal and state organizations. These policies and algorithms were followed during the patient's care in the ED.    As part of my medical decision making, I reviewed the following data within the electronic MEDICAL RECORD NUMBER Nursing notes reviewed and incorporated, Labs reviewed , Old chart reviewed, Notes from prior ED visits and [place] Controlled Substance Database  ____________________________________________   FINAL CLINICAL IMPRESSION(S) / ED DIAGNOSES  Final diagnoses:  Suspected COVID-19 virus infection      NEW MEDICATIONS STARTED DURING THIS VISIT:  New Prescriptions   No medications on file     Note:  This document was prepared using Dragon voice recognition software and may include unintentional dictation errors.    Faythe Ghee, PA-C 03/12/20 2204    Gilles Chiquito, MD 03/13/20  0001

## 2020-03-20 ENCOUNTER — Emergency Department
Admission: EM | Admit: 2020-03-20 | Discharge: 2020-03-20 | Disposition: A | Discharge status: Home / Self Care | Payer: Medicare Other | Attending: Emergency Medicine | Admitting: Emergency Medicine

## 2020-03-20 ENCOUNTER — Encounter: Payer: Self-pay | Admitting: Emergency Medicine

## 2020-03-20 ENCOUNTER — Emergency Department: Payer: Medicare Other

## 2020-03-20 ENCOUNTER — Other Ambulatory Visit: Payer: Self-pay

## 2020-03-20 DIAGNOSIS — F17210 Nicotine dependence, cigarettes, uncomplicated: Secondary | ICD-10-CM | POA: Diagnosis not present

## 2020-03-20 DIAGNOSIS — R1013 Epigastric pain: Secondary | ICD-10-CM | POA: Diagnosis present

## 2020-03-20 DIAGNOSIS — K8050 Calculus of bile duct without cholangitis or cholecystitis without obstruction: Secondary | ICD-10-CM

## 2020-03-20 LAB — URINALYSIS, COMPLETE (UACMP) WITH MICROSCOPIC
Bacteria, UA: NONE SEEN
Bilirubin Urine: NEGATIVE
Glucose, UA: NEGATIVE mg/dL
Hgb urine dipstick: NEGATIVE
Ketones, ur: NEGATIVE mg/dL
Leukocytes,Ua: NEGATIVE
Nitrite: NEGATIVE
Protein, ur: NEGATIVE mg/dL
Specific Gravity, Urine: 1.024 (ref 1.005–1.030)
pH: 6 (ref 5.0–8.0)

## 2020-03-20 LAB — CBC WITH DIFFERENTIAL/PLATELET
Abs Immature Granulocytes: 0.01 10*3/uL (ref 0.00–0.07)
Basophils Absolute: 0 10*3/uL (ref 0.0–0.1)
Basophils Relative: 0 %
Eosinophils Absolute: 0.3 10*3/uL (ref 0.0–0.5)
Eosinophils Relative: 5 %
HCT: 37.5 % (ref 36.0–46.0)
Hemoglobin: 11.8 g/dL — ABNORMAL LOW (ref 12.0–15.0)
Immature Granulocytes: 0 %
Lymphocytes Relative: 47 %
Lymphs Abs: 3.1 10*3/uL (ref 0.7–4.0)
MCH: 24.2 pg — ABNORMAL LOW (ref 26.0–34.0)
MCHC: 31.5 g/dL (ref 30.0–36.0)
MCV: 76.8 fL — ABNORMAL LOW (ref 80.0–100.0)
Monocytes Absolute: 0.3 10*3/uL (ref 0.1–1.0)
Monocytes Relative: 4 %
Neutro Abs: 3 10*3/uL (ref 1.7–7.7)
Neutrophils Relative %: 44 %
Platelets: 323 10*3/uL (ref 150–400)
RBC: 4.88 MIL/uL (ref 3.87–5.11)
RDW: 13.5 % (ref 11.5–15.5)
WBC: 6.8 10*3/uL (ref 4.0–10.5)
nRBC: 0 % (ref 0.0–0.2)

## 2020-03-20 LAB — COMPREHENSIVE METABOLIC PANEL
ALT: 34 U/L (ref 0–44)
AST: 30 U/L (ref 15–41)
Albumin: 4.3 g/dL (ref 3.5–5.0)
Alkaline Phosphatase: 76 U/L (ref 38–126)
Anion gap: 8 (ref 5–15)
BUN: 12 mg/dL (ref 6–20)
CO2: 25 mmol/L (ref 22–32)
Calcium: 9.6 mg/dL (ref 8.9–10.3)
Chloride: 105 mmol/L (ref 98–111)
Creatinine, Ser: 0.74 mg/dL (ref 0.44–1.00)
GFR, Estimated: >60 mL/min (ref >60)
Glucose, Bld: 94 mg/dL (ref 70–99)
Potassium: 3.9 mmol/L (ref 3.5–5.1)
Sodium: 138 mmol/L (ref 135–145)
Total Bilirubin: 0.7 mg/dL (ref 0.3–1.2)
Total Protein: 7.5 g/dL (ref 6.5–8.1)

## 2020-03-20 LAB — POCT PREGNANCY, URINE: Preg Test, Ur: NEGATIVE

## 2020-03-20 LAB — TROPONIN I (HIGH SENSITIVITY): Troponin I (High Sensitivity): <2 ng/L (ref <18)

## 2020-03-20 MED ORDER — OMEPRAZOLE 20 MG PO CPDR: 20.0000 mg | DELAYED_RELEASE_CAPSULE | Freq: Two times a day (BID) | ORAL | 1 refills | Status: DC

## 2020-03-20 MED ORDER — KETOROLAC TROMETHAMINE 30 MG/ML IJ SOLN
30.0000 mg | Freq: Once | INTRAMUSCULAR | Status: AC
  Administered 2020-03-20: 30 mg via INTRAVENOUS
  Filled 2020-03-20: qty 1

## 2020-03-20 MED ORDER — KETOROLAC TROMETHAMINE 10 MG PO TABS: 10.0000 mg | ORAL_TABLET | Freq: Four times a day (QID) | ORAL | 0 refills | Status: DC | PRN

## 2020-03-20 MED ORDER — DICYCLOMINE HCL 10 MG PO CAPS: 10.0000 mg | ORAL_CAPSULE | Freq: Four times a day (QID) | ORAL | 0 refills | Status: DC

## 2020-03-20 NOTE — ED Triage Notes (Signed)
Pt presents to ED with headaches and dizziness for the past several months with epigastric pain and generalized abd pain for the past month. Pt states sometimes it feels like its hard to take a deep breath in. Only time pt has relief from her pain is when she belches.

## 2020-03-20 NOTE — Discharge Instructions (Signed)
You have been prescribed Bentyl, Toradol and omeprazole for biliary colic. Toradol will also help with headaches at home.

## 2020-03-20 NOTE — ED Provider Notes (Signed)
Emergency Department Provider Note  ____________________________________________  Time seen: Approximately 11:14 PM  I have reviewed the triage vital signs and the nursing notes.   HISTORY  Chief Complaint No chief complaint on file.   Historian Patient     HPI Nyhla Kline is a 19 y.o. female presents to the emergency department with multiple medical complaints. She states that she has had occasional headaches for the past several months but states that she does not currently have a headache. Patient also states that she experiences epigastric abdominal pain after eating. She denies fever and chills. She states that she has been diagnosed with biliary colic in the past but never picked up medications so that she would feel better. She denies fever and chills at home. No abdominal trauma. She denies current chest pain or chest tightness. Denies current nausea. No vomiting at home. No other alleviating measures have been attempted.   History reviewed. No pertinent past medical history.   Immunizations up to date:  Yes.     History reviewed. No pertinent past medical history.  There are no problems to display for this patient.   Past Surgical History:  Procedure Laterality Date  . MANDIBLE SURGERY    . VAGINAL DELIVERY      Prior to Admission medications   Medication Sig Start Date End Date Taking? Authorizing Provider  baclofen (LIORESAL) 10 MG tablet Take 1 tablet (10 mg total) by mouth daily. 06/12/19 06/11/20  Fisher, Roselyn Bering, PA-C  dicyclomine (BENTYL) 10 MG capsule Take 1 capsule (10 mg total) by mouth 4 (four) times daily for 14 days. 03/20/20 04/03/20  Orvil Feil, PA-C  Hyoscyamine Sulfate SL (LEVSIN/SL) 0.125 MG SUBL Place 0.125 mg under the tongue 3 (three) times daily as needed. 02/19/20   Triplett, Rulon Eisenmenger B, FNP  ketorolac (TORADOL) 10 MG tablet Take 1 tablet (10 mg total) by mouth every 6 (six) hours as needed. 03/20/20   Orvil Feil, PA-C  meloxicam  (MOBIC) 15 MG tablet Take 1 tablet (15 mg total) by mouth daily. 06/30/19   Triplett, Rulon Eisenmenger B, FNP  omeprazole (PRILOSEC) 20 MG capsule Take 1 capsule (20 mg total) by mouth 2 (two) times daily. 03/20/20 03/20/21  Orvil Feil, PA-C    Allergies Shrimp [shellfish allergy]  No family history on file.  Social History Social History   Tobacco Use  . Smoking status: Current Some Day Smoker    Types: Cigarettes  . Smokeless tobacco: Never Used  Vaping Use  . Vaping Use: Never used  Substance Use Topics  . Alcohol use: Yes  . Drug use: Not Currently    Types: Marijuana     Review of Systems  Constitutional: No fever/chills Eyes:  No discharge ENT: No upper respiratory complaints. Respiratory: no cough. No SOB/ use of accessory muscles to breath Gastrointestinal: Patient has epigastric abdominal pain.  Musculoskeletal: Negative for musculoskeletal pain. Skin: Negative for rash, abrasions, lacerations, ecchymosis.   ____________________________________________   PHYSICAL EXAM:  VITAL SIGNS: ED Triage Vitals  Enc Vitals Group     BP 03/20/20 2013 123/72     Pulse Rate 03/20/20 2013 78     Resp 03/20/20 2013 18     Temp 03/20/20 2013 98.3 F (36.8 C)     Temp Source 03/20/20 2013 Oral     SpO2 03/20/20 2013 100 %     Weight 03/20/20 2014 195 lb (88.5 kg)     Height 03/20/20 2014 5\' 5"  (1.651 m)  Head Circumference --      Peak Flow --      Pain Score 03/20/20 2027 0     Pain Loc --      Pain Edu? --      Excl. in GC? --      Constitutional: Alert and oriented. Well appearing and in no acute distress. Eyes: Conjunctivae are normal. PERRL. EOMI. Head: Atraumatic. ENT:      Ears: TMs are pearly.       Nose: No congestion/rhinnorhea.      Mouth/Throat: Mucous membranes are moist.  Neck: No stridor.  No cervical spine tenderness to palpation. Cardiovascular: Normal rate, regular rhythm. Normal S1 and S2.  Good peripheral circulation. Respiratory: Normal  respiratory effort without tachypnea or retractions. Lungs CTAB. Good air entry to the bases with no decreased or absent breath sounds Gastrointestinal: Bowel sounds x 4 quadrants. Soft and nontender to palpation. No guarding or rigidity. No distention. Musculoskeletal: Full range of motion to all extremities. No obvious deformities noted Neurologic:  Normal for age. No gross focal neurologic deficits are appreciated.  Skin:  Skin is warm, dry and intact. No rash noted. Psychiatric: Mood and affect are normal for age. Speech and behavior are normal.   ____________________________________________   LABS (all labs ordered are listed, but only abnormal results are displayed)  Labs Reviewed  CBC WITH DIFFERENTIAL/PLATELET - Abnormal; Notable for the following components:      Result Value   Hemoglobin 11.8 (*)    MCV 76.8 (*)    MCH 24.2 (*)    All other components within normal limits  URINALYSIS, COMPLETE (UACMP) WITH MICROSCOPIC - Abnormal; Notable for the following components:   Color, Urine YELLOW (*)    APPearance HAZY (*)    All other components within normal limits  COMPREHENSIVE METABOLIC PANEL  POC URINE PREG, ED  POCT PREGNANCY, URINE  TROPONIN I (HIGH SENSITIVITY)  TROPONIN I (HIGH SENSITIVITY)   ____________________________________________  EKG   ____________________________________________  RADIOLOGY Geraldo Pitter, personally viewed and evaluated these images (plain radiographs) as part of my medical decision making, as well as reviewing the written report by the radiologist.  DG Chest 2 View  Result Date: 03/20/2020 CLINICAL DATA:  Chest pain, shortness of breath EXAM: CHEST - 2 VIEW COMPARISON:  Radiograph 03/01/2020 FINDINGS: No consolidation, features of edema, pneumothorax, or effusion. Pulmonary vascularity is normally distributed. The cardiomediastinal contours are unremarkable. No acute osseous or soft tissue abnormality. IMPRESSION: No acute  cardiopulmonary abnormality. Electronically Signed   By: Kreg Shropshire M.D.   On: 03/20/2020 20:50    ____________________________________________    PROCEDURES  Procedure(s) performed:     Procedures     Medications  ketorolac (TORADOL) 30 MG/ML injection 30 mg (30 mg Intravenous Given 03/20/20 2252)     ____________________________________________   INITIAL IMPRESSION / ASSESSMENT AND PLAN / ED COURSE  Pertinent labs & imaging results that were available during my care of the patient were reviewed by me and considered in my medical decision making (see chart for details).      Assessment and plan Biliary colic 19 year old female presents to the emergency department with occasional epigastric abdominal pain that occurs after eating without fever and chills.  Vital signs are reassuring at triage. On physical exam, abdomen was soft and nontender without guarding. CBC indicated no leukocytosis and CMP was within reference range. Urinalysis revealed no signs of UTI and urine pregnancy testing was negative. Troponin was within reference range.  We will treat patient with Bentyl, Toradol omeprazole at home. Explained to patient that Toradol will also help her occasional headaches. Return precautions were given to return with new or worsening symptoms.    ____________________________________________  FINAL CLINICAL IMPRESSION(S) / ED DIAGNOSES  Final diagnoses:  Biliary colic      NEW MEDICATIONS STARTED DURING THIS VISIT:  ED Discharge Orders         Ordered    ketorolac (TORADOL) 10 MG tablet  Every 6 hours PRN        03/20/20 2252    dicyclomine (BENTYL) 10 MG capsule  4 times daily        03/20/20 2252    omeprazole (PRILOSEC) 20 MG capsule  2 times daily        03/20/20 2252              This chart was dictated using voice recognition software/Dragon. Despite best efforts to proofread, errors can occur which can change the meaning. Any change was  purely unintentional.     Orvil Feil, PA-C 03/20/20 Janetta Hora    Phineas Semen, MD 03/21/20 [phone]

## 2020-05-07 ENCOUNTER — Encounter: Payer: Self-pay | Admitting: Emergency Medicine

## 2020-05-07 ENCOUNTER — Emergency Department
Admission: EM | Admit: 2020-05-07 | Discharge: 2020-05-07 | Disposition: A | Discharge status: Home / Self Care | Payer: Medicare Other | Attending: Emergency Medicine | Admitting: Emergency Medicine

## 2020-05-07 ENCOUNTER — Other Ambulatory Visit: Payer: Self-pay

## 2020-05-07 DIAGNOSIS — K8050 Calculus of bile duct without cholangitis or cholecystitis without obstruction: Secondary | ICD-10-CM | POA: Diagnosis not present

## 2020-05-07 DIAGNOSIS — Z20822 Contact with and (suspected) exposure to covid-19: Secondary | ICD-10-CM | POA: Insufficient documentation

## 2020-05-07 DIAGNOSIS — F17210 Nicotine dependence, cigarettes, uncomplicated: Secondary | ICD-10-CM | POA: Diagnosis not present

## 2020-05-07 DIAGNOSIS — K5901 Slow transit constipation: Secondary | ICD-10-CM | POA: Insufficient documentation

## 2020-05-07 DIAGNOSIS — R109 Unspecified abdominal pain: Secondary | ICD-10-CM | POA: Diagnosis present

## 2020-05-07 LAB — COMPREHENSIVE METABOLIC PANEL
ALT: 20 U/L (ref 0–44)
AST: 17 U/L (ref 15–41)
Albumin: 4.4 g/dL (ref 3.5–5.0)
Alkaline Phosphatase: 95 U/L (ref 38–126)
Anion gap: 7 (ref 5–15)
BUN: 15 mg/dL (ref 6–20)
CO2: 23 mmol/L (ref 22–32)
Calcium: 9.9 mg/dL (ref 8.9–10.3)
Chloride: 106 mmol/L (ref 98–111)
Creatinine, Ser: 0.68 mg/dL (ref 0.44–1.00)
GFR, Estimated: >60 mL/min (ref >60)
Glucose, Bld: 102 mg/dL — ABNORMAL HIGH (ref 70–99)
Potassium: 4 mmol/L (ref 3.5–5.1)
Sodium: 136 mmol/L (ref 135–145)
Total Bilirubin: 1 mg/dL (ref 0.3–1.2)
Total Protein: 7.8 g/dL (ref 6.5–8.1)

## 2020-05-07 LAB — RESP PANEL BY RT-PCR (FLU A&B, COVID) ARPGX2
Influenza A by PCR: NEGATIVE
Influenza B by PCR: NEGATIVE
SARS Coronavirus 2 by RT PCR: NEGATIVE

## 2020-05-07 LAB — URINALYSIS, COMPLETE (UACMP) WITH MICROSCOPIC
Bilirubin Urine: NEGATIVE
Glucose, UA: NEGATIVE mg/dL
Hgb urine dipstick: NEGATIVE
Ketones, ur: NEGATIVE mg/dL
Nitrite: NEGATIVE
Protein, ur: NEGATIVE mg/dL
Specific Gravity, Urine: 1.021 (ref 1.005–1.030)
pH: 6 (ref 5.0–8.0)

## 2020-05-07 LAB — CBC
HCT: 40.5 % (ref 36.0–46.0)
Hemoglobin: 12.8 g/dL (ref 12.0–15.0)
MCH: 24.1 pg — ABNORMAL LOW (ref 26.0–34.0)
MCHC: 31.6 g/dL (ref 30.0–36.0)
MCV: 76.3 fL — ABNORMAL LOW (ref 80.0–100.0)
Platelets: 319 10*3/uL (ref 150–400)
RBC: 5.31 MIL/uL — ABNORMAL HIGH (ref 3.87–5.11)
RDW: 13.7 % (ref 11.5–15.5)
WBC: 6 10*3/uL (ref 4.0–10.5)
nRBC: 0 % (ref 0.0–0.2)

## 2020-05-07 LAB — POC URINE PREG, ED: Preg Test, Ur: NEGATIVE

## 2020-05-07 LAB — LIPASE, BLOOD: Lipase: 41 U/L (ref 11–51)

## 2020-05-07 MED ORDER — POLYETHYLENE GLYCOL 3350 17 G PO PACK: 17.0000 g | PACK | Freq: Every day | ORAL | 2 refills | Status: DC

## 2020-05-07 NOTE — ED Notes (Signed)
Pt presents to the ED for upper abdominal pain and nausea for the past 3 months. Pt has been seen here before the same. Pt is A&Ox4 and NAD. Pt also requesting a COVID swab.

## 2020-05-07 NOTE — ED Triage Notes (Signed)
Pt reports abd pain for months. Pt reports was seen here for the same and they gave her pills. Pt reports admits she did not follow up as advised.

## 2020-05-07 NOTE — ED Provider Notes (Signed)
[facility] Emergency Department Provider Note  ____________________________________________  Time seen: Approximately 1:05 PM  I have reviewed the triage vital signs and the nursing notes.   HISTORY  Chief Complaint Abdominal Pain    HPI Brittney Kline is a 19 y.o. female who presents the emergency department complaining of multiple vague abdominal complaints.  Patient states that she has had symptoms for many months.  Patient has been seen twice within the past 3 months for similar complaints.  Patient has been diagnosed with biliary colic.  She denies any changes to include fever, emesis, diarrhea, urinary symptoms.  Patient states that she has vague intermittent cramps and abdominal pain.  It seems to be more localized in the epigastric region.  She will have nausea, especially after meals but has not had emesis.  She states that she is chronically mildly constipated but there is been no significant changes.  No diarrhea.  Patient has had no blood in her stools.  Patient has been started on omeprazole and Bentyl.  Patient states that she takes the Bentyl very infrequently and is just started taking the omeprazole.  She has not followed up with GI.  Patient denies any fevers, nasal congestion, sore throat, cough, diarrhea, dysuria, polyuria, hematuria.  Patient is requesting Covid swab to go back to work has given some of her GI symptoms, she was taken out of work for possible Covid.  No recent Covid contact.         History reviewed. No pertinent past medical history.  There are no problems to display for this patient.   Past Surgical History:  Procedure Laterality Date  . MANDIBLE SURGERY    . VAGINAL DELIVERY      Prior to Admission medications   Medication Sig Start Date End Date Taking? Authorizing Provider  baclofen (LIORESAL) 10 MG tablet Take 1 tablet (10 mg total) by mouth daily. 06/12/19 06/11/20  Fisher, Roselyn Bering, PA-C  dicyclomine (BENTYL) 10 MG  capsule Take 1 capsule (10 mg total) by mouth 4 (four) times daily for 14 days. 03/20/20 04/03/20  Orvil Feil, PA-C  Hyoscyamine Sulfate SL (LEVSIN/SL) 0.125 MG SUBL Place 0.125 mg under the tongue 3 (three) times daily as needed. 02/19/20   Triplett, Rulon Eisenmenger B, FNP  ketorolac (TORADOL) 10 MG tablet Take 1 tablet (10 mg total) by mouth every 6 (six) hours as needed. 03/20/20   Orvil Feil, PA-C  meloxicam (MOBIC) 15 MG tablet Take 1 tablet (15 mg total) by mouth daily. 06/30/19   Triplett, Rulon Eisenmenger B, FNP  omeprazole (PRILOSEC) 20 MG capsule Take 1 capsule (20 mg total) by mouth 2 (two) times daily. 03/20/20 03/20/21  Orvil Feil, PA-C  polyethylene glycol (MIRALAX) 17 g packet Take 17 g by mouth daily. 05/07/20   Shakea Isip, Delorise Royals, PA-C    Allergies Shrimp [shellfish allergy]  No family history on file.  Social History Social History   Tobacco Use  . Smoking status: Current Some Day Smoker    Types: Cigarettes  . Smokeless tobacco: Never Used  Vaping Use  . Vaping Use: Never used  Substance Use Topics  . Alcohol use: Yes  . Drug use: Not Currently    Types: Marijuana     Review of Systems  Constitutional: No fever/chills Eyes: No visual changes. No discharge ENT: No upper respiratory complaints. Cardiovascular: no chest pain. Respiratory: no cough. No SOB. Gastrointestinal: Intermittent epigastric pain/cramping.Marland Kitchen  Positive nausea, no vomiting.  No diarrhea.  Mild chronic constipation.  Genitourinary: Negative for dysuria. No hematuria Musculoskeletal: Negative for musculoskeletal pain. Skin: Negative for rash, abrasions, lacerations, ecchymosis. Neurological: Negative for headaches, focal weakness or numbness.  10 System ROS otherwise negative.  ____________________________________________   PHYSICAL EXAM:  VITAL SIGNS: ED Triage Vitals  Enc Vitals Group     BP 05/07/20 0902 (!) 124/56     Pulse Rate 05/07/20 0902 85     Resp 05/07/20 0902 20     Temp  05/07/20 0902 97.7 F (36.5 C)     Temp Source 05/07/20 0902 Oral     SpO2 05/07/20 0902 97 %     Weight 05/07/20 0903 185 lb (83.9 kg)     Height 05/07/20 0903 5\' 5"  (1.651 m)     Head Circumference --      Peak Flow --      Pain Score 05/07/20 0902 6     Pain Loc --      Pain Edu? --      Excl. in GC? --      Constitutional: Alert and oriented. Well appearing and in no acute distress. Eyes: Conjunctivae are normal. PERRL. EOMI. Head: Atraumatic. ENT:      Ears:       Nose: No congestion/rhinnorhea.      Mouth/Throat: Mucous membranes are moist.  Neck: No stridor.  Neck is supple full range of motion Hematological/Lymphatic/Immunilogical: No cervical lymphadenopathy. Cardiovascular: Normal rate, regular rhythm. Normal S1 and S2.  Good peripheral circulation. Respiratory: Normal respiratory effort without tachypnea or retractions. Lungs CTAB. Good air entry to the bases with no decreased or absent breath sounds. Gastrointestinal: Bowel sounds 4 quadrants.  Soft to palpation all quadrants.  Patient had mild vague tenderness throughout the epigastric and upper quadrants.  No point specific tenderness.  No Murphy sign.  No tenderness in the lower quadrants.  No guarding or rigidity. No palpable masses. No distention. No CVA tenderness. Musculoskeletal: Full range of motion to all extremities. No gross deformities appreciated. Neurologic:  Normal speech and language. No gross focal neurologic deficits are appreciated.  Skin:  Skin is warm, dry and intact. No rash noted. Psychiatric: Mood and affect are normal. Speech and behavior are normal. Patient exhibits appropriate insight and judgement.   ____________________________________________   LABS (all labs ordered are listed, but only abnormal results are displayed)  Labs Reviewed  COMPREHENSIVE METABOLIC PANEL - Abnormal; Notable for the following components:      Result Value   Glucose, Bld 102 (*)    All other components  within normal limits  CBC - Abnormal; Notable for the following components:   RBC 5.31 (*)    MCV 76.3 (*)    MCH 24.1 (*)    All other components within normal limits  URINALYSIS, COMPLETE (UACMP) WITH MICROSCOPIC - Abnormal; Notable for the following components:   Color, Urine YELLOW (*)    APPearance HAZY (*)    Leukocytes,Ua TRACE (*)    Bacteria, UA RARE (*)    All other components within normal limits  RESP PANEL BY RT-PCR (FLU A&B, COVID) ARPGX2  LIPASE, BLOOD  POC URINE PREG, ED   ____________________________________________  EKG   ____________________________________________  RADIOLOGY   No results found.  ____________________________________________    PROCEDURES  Procedure(s) performed:    Procedures    Medications - No data to display   ____________________________________________   INITIAL IMPRESSION / ASSESSMENT AND PLAN / ED COURSE  Pertinent labs & imaging results that were available during my care of  the patient were reviewed by me and considered in my medical decision making (see chart for details).  Review of the [place] CSRS was performed in accordance of the [facility] prior to dispensing any controlled drugs.           Patient's diagnosis is consistent with biliary colic.  Patient presented to the emergency department with epigastric pain that is intermittent in nature, somewhat vague in description.  Patient has a history of biliary colic, states that symptoms are similar just have been ongoing.  Patient just started the medications prescribed from the last visit 2 months ago.  No significant improvement yet on Bentyl or omeprazole.  Patient did not follow-up with GI.  Patient is afebrile.  Exam was reassuring with just minimal tenderness in the epigastric region.  No indication for imaging.  Patient did have some white blood cells and bacteria in the urine but had no flank pain, no dysuria, polyuria and I do not feel that this is likely a UTI.   Differential includes IBS, colitis, pancreatitis, gastritis, gastric ulcer, biliary colic, cholecystitis, Crohn's, celiac's disease.  At this time I have recommended following up with GI for further investigation of ongoing GI complaints.  Patient does have some chronic constipation but denied any significant worsening.  I have instructed to use extra fluids and MiraLAX which I will prescribe to help alleviate this chronic constipation.  Exam is otherwise stable.  We will test the patient for Covid at her request though she is largely asymptomatic.  Follow-up primary care and/or GI.  Patient is given ED precautions to return to the ED for any worsening or new symptoms.     ____________________________________________  FINAL CLINICAL IMPRESSION(S) / ED DIAGNOSES  Final diagnoses:  Biliary colic  Slow transit constipation      NEW MEDICATIONS STARTED DURING THIS VISIT:  ED Discharge Orders         Ordered    polyethylene glycol (MIRALAX) 17 g packet  Daily        05/07/20 1350              This chart was dictated using voice recognition software/Dragon. Despite best efforts to proofread, errors can occur which can change the meaning. Any change was purely unintentional.    Racheal Patches, PA-C 05/07/20 1351    Sharman Cheek, MD 05/09/20 [phone]

## 2020-06-15 ENCOUNTER — Emergency Department
Admission: EM | Admit: 2020-06-15 | Discharge: 2020-06-15 | Disposition: A | Discharge status: Home / Self Care | Payer: Medicare Other | Attending: Emergency Medicine | Admitting: Emergency Medicine

## 2020-06-15 ENCOUNTER — Emergency Department: Payer: Medicare Other

## 2020-06-15 ENCOUNTER — Other Ambulatory Visit: Payer: Self-pay

## 2020-06-15 DIAGNOSIS — F17210 Nicotine dependence, cigarettes, uncomplicated: Secondary | ICD-10-CM | POA: Insufficient documentation

## 2020-06-15 DIAGNOSIS — R519 Headache, unspecified: Secondary | ICD-10-CM | POA: Diagnosis not present

## 2020-06-15 MED ORDER — IOHEXOL 350 MG/ML SOLN
75.0000 mL | Freq: Once | INTRAVENOUS | Status: AC | PRN
  Administered 2020-06-15: 75 mL via INTRAVENOUS
  Filled 2020-06-15: qty 75

## 2020-06-15 MED ORDER — METOCLOPRAMIDE HCL 5 MG PO TABS
5.0000 mg | ORAL_TABLET | Freq: Three times a day (TID) | ORAL | 0 refills | Status: DC | PRN
Start: 2020-06-15 — End: 2020-08-01

## 2020-06-15 NOTE — ED Provider Notes (Signed)
[facility] Emergency Department Provider Note ____________________________________________  Time seen: 1050  I have reviewed the triage vital signs and the nursing notes.  HISTORY  Chief Complaint  Headache  HPI Brittney Kline is a 20 y.o. female, with no significant medical history, presents herself to the ED for evaluation and concern over a right-sided headache history.  Patient reports her older sister died in her early 47s from a ruptured aneurysm.  Patient reports a 2-year history of intermittent, persistent right-sided headaches. She reports she has headaches every month.  She describes the headache character is pulsatile in nature.  She denies any acute vision loss, nausea, vomiting, dizziness, weakness.   History reviewed. No pertinent past medical history.  There are no problems to display for this patient.   Past Surgical History:  Procedure Laterality Date  . MANDIBLE SURGERY    . VAGINAL DELIVERY      Prior to Admission medications   Medication Sig Start Date End Date Taking? Authorizing Provider  metoCLOPramide (REGLAN) 5 MG tablet Take 1 tablet (5 mg total) by mouth every 8 (eight) hours as needed for up to 5 days for nausea or vomiting. 06/15/20 06/20/20 Yes Taylr Meuth, Charlesetta Ivory, PA-C  dicyclomine (BENTYL) 10 MG capsule Take 1 capsule (10 mg total) by mouth 4 (four) times daily for 14 days. 03/20/20 04/03/20  Orvil Feil, PA-C  Hyoscyamine Sulfate SL (LEVSIN/SL) 0.125 MG SUBL Place 0.125 mg under the tongue 3 (three) times daily as needed. 02/19/20   Triplett, Rulon Eisenmenger B, FNP  ketorolac (TORADOL) 10 MG tablet Take 1 tablet (10 mg total) by mouth every 6 (six) hours as needed. 03/20/20   Orvil Feil, PA-C  meloxicam (MOBIC) 15 MG tablet Take 1 tablet (15 mg total) by mouth daily. 06/30/19   Triplett, Rulon Eisenmenger B, FNP  omeprazole (PRILOSEC) 20 MG capsule Take 1 capsule (20 mg total) by mouth 2 (two) times daily. 03/20/20 03/20/21  Orvil Feil,  PA-C  polyethylene glycol (MIRALAX) 17 g packet Take 17 g by mouth daily. 05/07/20   Cuthriell, Delorise Royals, PA-C    Allergies Shrimp [shellfish allergy]  History reviewed. No pertinent family history.  Social History Social History   Tobacco Use  . Smoking status: Current Some Day Smoker    Types: Cigarettes  . Smokeless tobacco: Never Used  Vaping Use  . Vaping Use: Never used  Substance Use Topics  . Alcohol use: Yes  . Drug use: Not Currently    Types: Marijuana    Review of Systems  Constitutional: Negative for fever. Eyes: Negative for visual changes. ENT: Negative for sore throat. Cardiovascular: Negative for chest pain. Respiratory: Negative for shortness of breath. Gastrointestinal: Negative for abdominal pain, vomiting and diarrhea. Genitourinary: Negative for dysuria. Musculoskeletal: Negative for back pain. Skin: Negative for rash. Neurological: Negative for focal weakness or numbness. Right-sided headache as above.  ____________________________________________  PHYSICAL EXAM:  VITAL SIGNS: ED Triage Vitals  Enc Vitals Group     BP 06/15/20 1019 112/69     Pulse Rate 06/15/20 1019 92     Resp 06/15/20 1019 17     Temp 06/15/20 1019 97.6 F (36.4 C)     Temp Source 06/15/20 1019 Oral     SpO2 06/15/20 1019 99 %     Weight 06/15/20 1020 198 lb (89.8 kg)     Height 06/15/20 1020 5\' 5"  (1.651 m)     Head Circumference --      Peak Flow --  Pain Score 06/15/20 1019 3     Pain Loc --      Pain Edu? --      Excl. in GC? --     Constitutional: Alert and oriented. Well appearing and in no distress. GCS=15 Head: Normocephalic and atraumatic. Eyes: Conjunctivae are normal. Normal extraocular movements Cardiovascular: Normal rate, regular rhythm. Normal distal pulses. Respiratory: Normal respiratory effort. No wheezes/rales/rhonchi. Musculoskeletal: Nontender with normal range of motion in all extremities.  Neurologic: Cranial nerves II through XII  grossly intact.  Normal gait without ataxia. Normal speech and language. No gross focal neurologic deficits are appreciated. Skin:  Skin is warm, dry and intact. No rash noted. Psychiatric: Mood and affect are normal. Patient exhibits appropriate insight and judgment. ____________________________________________   RADIOLOGY  CTA Head w/ - w/o CM  IMPRESSION: 1. No evidence of acute intracranial abnormality. 2. No large vessel occlusion or proximal hemodynamically significant stenosis. 3. No aneurysm identified. ____________________________________________  PROCEDURES  Procedures ____________________________________________  INITIAL IMPRESSION / ASSESSMENT AND PLAN / ED COURSE  Differential diagnosis includes, but is not limited to, intracranial hemorrhage, meningitis/encephalitis, previous head trauma, cavernous venous thrombosis, tension headache, temporal arteritis, migraine or migraine equivalent, idiopathic intracranial hypertension, and non-specific headache.  Patient with ED evaluation of a chronic headache syndrome, presenting with concern about possible intracranial aneurysm.  Patient's exam is overall benign at this time.  CT is negative for any acute findings including any aneurysmal vessels.  Patient is reassured by her work-up at this time.  She is discharged to follow-up with primary provider if ongoing symptoms.  She is encouraged to take over-the-counter Tylenol, Motrin, and Benadryl for headache management.  Return precautions have been discussed.  Brittney Kline was evaluated in Emergency Department on 06/15/2020 for the symptoms described in the history of present illness. She was evaluated in the context of the global COVID-19 pandemic, which necessitated consideration that the patient might be at risk for infection with the SARS-CoV-2 virus that causes COVID-19. Institutional protocols and algorithms that pertain to the evaluation of patients at risk for COVID-19 are in a  state of rapid change based on information released by regulatory bodies including the CDC and federal and state organizations. These policies and algorithms were followed during the patient's care in the ED. ____________________________________________  FINAL CLINICAL IMPRESSION(S) / ED DIAGNOSES  Final diagnoses:  Acute nonintractable headache, unspecified headache type      Lissa Hoard, PA-C 06/15/20 1544    Delton Prairie, MD 06/16/20 1515

## 2020-06-15 NOTE — ED Triage Notes (Signed)
Pt c/o right sided HA for the past week,, states she is concerned and wants a CT due to her older sister passing with a brain aneurysm in her 85's.. pt is in NAD at present.

## 2020-06-15 NOTE — ED Notes (Signed)
No e-signature pad available at this time.  Pt verbalized understanding of all discharge instructions.

## 2020-06-15 NOTE — Discharge Instructions (Addendum)
Your exam and CT scan of the brain was negative for an aneurysm. Follow-up with your primary provider for continued symptoms. Take OTC Tylenol, Motrin, Benadryl, and the prescription nausea medicine for headache pain relief.

## 2020-07-03 ENCOUNTER — Other Ambulatory Visit: Payer: Self-pay | Admitting: Gastroenterology

## 2020-07-03 DIAGNOSIS — R1011 Right upper quadrant pain: Secondary | ICD-10-CM

## 2020-07-03 DIAGNOSIS — R1013 Epigastric pain: Secondary | ICD-10-CM

## 2020-07-03 DIAGNOSIS — R110 Nausea: Secondary | ICD-10-CM

## 2020-07-12 ENCOUNTER — Ambulatory Visit: Admission: RE | Admit: 2020-07-12 | Payer: Medicare Other | Source: Ambulatory Visit

## 2020-07-25 ENCOUNTER — Other Ambulatory Visit: Payer: Self-pay

## 2020-07-25 ENCOUNTER — Ambulatory Visit
Admission: RE | Admit: 2020-07-25 | Discharge: 2020-07-25 | Disposition: A | Discharge status: Home / Self Care | Payer: Medicare Other | Source: Ambulatory Visit | Attending: Gastroenterology | Admitting: Gastroenterology

## 2020-07-25 DIAGNOSIS — R1011 Right upper quadrant pain: Secondary | ICD-10-CM

## 2020-07-25 DIAGNOSIS — R1013 Epigastric pain: Secondary | ICD-10-CM

## 2020-07-25 DIAGNOSIS — R110 Nausea: Secondary | ICD-10-CM | POA: Diagnosis present

## 2020-08-01 ENCOUNTER — Ambulatory Visit (INDEPENDENT_AMBULATORY_CARE_PROVIDER_SITE_OTHER): Payer: Medicare Other | Admitting: Adult Health

## 2020-08-01 ENCOUNTER — Other Ambulatory Visit: Payer: Self-pay

## 2020-08-01 ENCOUNTER — Encounter: Payer: Self-pay | Admitting: Adult Health

## 2020-08-01 VITALS — BP 117/69 | HR 68 | Temp 98.5°F | Resp 16 | Ht 65.0 in | Wt 215.0 lb

## 2020-08-01 DIAGNOSIS — F419 Anxiety disorder, unspecified: Secondary | ICD-10-CM | POA: Diagnosis not present

## 2020-08-01 DIAGNOSIS — F319 Bipolar disorder, unspecified: Secondary | ICD-10-CM

## 2020-08-01 DIAGNOSIS — F32A Depression, unspecified: Secondary | ICD-10-CM

## 2020-08-01 DIAGNOSIS — K219 Gastro-esophageal reflux disease without esophagitis: Secondary | ICD-10-CM

## 2020-08-01 DIAGNOSIS — R5383 Other fatigue: Secondary | ICD-10-CM | POA: Diagnosis not present

## 2020-08-01 DIAGNOSIS — M542 Cervicalgia: Secondary | ICD-10-CM | POA: Insufficient documentation

## 2020-08-01 DIAGNOSIS — R519 Headache, unspecified: Secondary | ICD-10-CM | POA: Insufficient documentation

## 2020-08-01 DIAGNOSIS — Z6834 Body mass index (BMI) 34.0-34.9, adult: Secondary | ICD-10-CM

## 2020-08-01 MED ORDER — HYDROXYZINE HCL 10 MG PO TABS: 10.0000 mg | ORAL_TABLET | Freq: Three times a day (TID) | ORAL | 0 refills | Status: DC | PRN

## 2020-08-01 MED ORDER — PANTOPRAZOLE SODIUM 40 MG PO TBEC: 40.0000 mg | DELAYED_RELEASE_TABLET | Freq: Every day | ORAL | 1 refills | Status: DC

## 2020-08-01 NOTE — Progress Notes (Addendum)
New patient visit   Patient: Brittney Kline   DOB: 08-29-2000   19 y.o. Female  MRN: [id number] Visit Date: 08/01/2020  Today's healthcare provider: Jairo Ben, FNP   Chief Complaint  Patient presents with  . New Patient (Initial Visit)   Subjective    Brittney Kline is a 20 y.o. female who presents today as a new patient to establish care.  HPI  Patient presents in office today she states that she feels fairly well but does have concerns to address. Patient reports that she follows a general diet and eats up to 4 meals a day, she reports that she is staying active and exercises 1-2x a week, sleep habits are fair. Patient reports for the past two weeks she has had concerns of blurred vision in both eyes and states that she has been seeing white spots in her vision. Patient would like to address mid epigastric pain/pressure that has been present for 2 months, patient does report history of GERD  She has not had a eye exam.   Patient  denies any fever, body aches,chills, rash, chest pain, shortness of breath, nausea, vomiting, or diarrhea.  Denies dizziness, lightheadedness, pre syncopal or syncopal episodes.   . Past Medical History:  Diagnosis Date  . Allergy   . Anemia   . Anxiety   . Asthma   . Bipolar 1 disorder (HCC)   . Depression   . GERD (gastroesophageal reflux disease)   . Obesity    Past Surgical History:  Procedure Laterality Date  . MANDIBLE SURGERY    . VAGINAL DELIVERY     Family Status  Relation Name Status  . Mother  (Not Specified)  . Father  (Not Specified)  . Son  (Not Specified)  . MGM  Deceased  . MGF  (Not Specified)   Family History  Problem Relation Age of Onset  . Arthritis Mother   . Bipolar disorder Mother   . Bipolar disorder Father   . Diabetes Father   . Cerebral palsy Son   . Cancer Maternal Grandfather   . Anxiety disorder Maternal Grandfather    Social History   Socioeconomic History  . Marital status:  Single    Spouse name: Not on file  . Number of children: Not on file  . Years of education: Not on file  . Highest education level: Not on file  Occupational History  . Not on file  Tobacco Use  . Smoking status: Former Smoker    Types: Cigarettes  . Smokeless tobacco: Never Used  Vaping Use  . Vaping Use: Never used  Substance and Sexual Activity  . Alcohol use: Yes    Alcohol/week: 1.0 standard drink    Types: 1 Glasses of wine per week  . Drug use: Not Currently    Types: Marijuana  . Sexual activity: Yes    Birth control/protection: Injection  Other Topics Concern  . Not on file  Social History Narrative  . Not on file   Social Determinants of Health   Financial Resource Strain: Not on file  Food Insecurity: Not on file  Transportation Needs: Not on file  Physical Activity: Not on file  Stress: Not on file  Social Connections: Not on file   Outpatient Medications Prior to Visit  Medication Sig  . Docusate Sodium (DSS) 100 MG CAPS Take 1 capsule by mouth 2 (two) times daily.  . [DISCONTINUED] acetaminophen (TYLENOL) 325 MG tablet Take by mouth.  . [DISCONTINUED] pantoprazole (  PROTONIX) 40 MG tablet Take by mouth.  Marland Kitchen. albuterol (VENTOLIN HFA) 108 (90 Base) MCG/ACT inhaler Inhale into the lungs.  . cloNIDine (CATAPRES) 0.1 MG tablet   . [DISCONTINUED] diclofenac (VOLTAREN) 75 MG EC tablet Take 75 mg by mouth 2 (two) times daily.  . [DISCONTINUED] dicyclomine (BENTYL) 10 MG capsule Take 1 capsule (10 mg total) by mouth 4 (four) times daily for 14 days.  . [DISCONTINUED] hydrOXYzine (ATARAX/VISTARIL) 25 MG tablet Take by mouth.  . [DISCONTINUED] Hyoscyamine Sulfate SL (LEVSIN/SL) 0.125 MG SUBL Place 0.125 mg under the tongue 3 (three) times daily as needed.  . [DISCONTINUED] ketorolac (TORADOL) 10 MG tablet Take 1 tablet (10 mg total) by mouth every 6 (six) hours as needed.  . [DISCONTINUED] meloxicam (MOBIC) 15 MG tablet Take 1 tablet (15 mg total) by mouth daily.  .  [DISCONTINUED] metoCLOPramide (REGLAN) 5 MG tablet Take 1 tablet (5 mg total) by mouth every 8 (eight) hours as needed for up to 5 days for nausea or vomiting.  . [DISCONTINUED] omeprazole (PRILOSEC) 20 MG capsule Take 1 capsule (20 mg total) by mouth 2 (two) times daily.  . [DISCONTINUED] polyethylene glycol (MIRALAX) 17 g packet Take 17 g by mouth daily.  . [DISCONTINUED] prazosin (MINIPRESS) 1 MG capsule Take 1-2 mg by mouth at bedtime.   No facility-administered medications prior to visit.   Allergies  Allergen Reactions  . Shellfish Allergy Anaphylaxis  . Peanut-Containing Drug Products Other (See Comments)    Immunization History  Administered Date(s) Administered  . Influenza,inj,Quad PF,6+ Mos 08/05/2017, 07/15/2018, 07/22/2019  . Influenza-Unspecified 08/04/2017  . Tdap 07/15/2018    Health Maintenance  Topic Date Due  . Hepatitis C Screening  Never done  . COVID-19 Vaccine (1) Never done  . HIV Screening  Never done  . INFLUENZA VACCINE  01/09/2020  . PAP-Cervical Cytology Screening  Never done  . PAP SMEAR-Modifier  Never done  . TETANUS/TDAP  07/15/2028    Patient Care Team: Berniece PapFlinchum, Vivek Grealish S, FNP as PCP - General (Family Medicine)  Review of Systems  Constitutional: Positive for appetite change.  HENT: Positive for congestion, dental problem, sinus pressure and tinnitus.   Eyes: Positive for photophobia and visual disturbance.  Respiratory: Positive for chest tightness and shortness of breath.   Gastrointestinal: Positive for abdominal distention, abdominal pain, constipation, diarrhea and nausea.  Endocrine: Positive for polyphagia.  Musculoskeletal: Positive for joint swelling, neck pain and neck stiffness.  Allergic/Immunologic: Positive for food allergies.  Neurological: Positive for light-headedness and headaches.  Psychiatric/Behavioral: Positive for behavioral problems and hallucinations. The patient is nervous/anxious.   All other systems reviewed  and are negative.   Last CBC Lab Results  Component Value Date   WBC 6.2 08/02/2020   HGB 12.0 08/02/2020   HCT 39.2 08/02/2020   MCV 76.9 (L) 08/02/2020   MCH 23.5 (L) 08/02/2020   RDW 13.8 08/02/2020   PLT 264 08/02/2020   Last metabolic panel Lab Results  Component Value Date   GLUCOSE 122 (H) 08/02/2020   NA 138 08/02/2020   K 3.5 08/02/2020   CL 107 08/02/2020   CO2 25 08/02/2020   BUN 10 08/02/2020   CREATININE 0.74 08/02/2020   GFRNONAA >60 08/02/2020   GFRAA >60 03/01/2020   CALCIUM 9.1 08/02/2020   PROT 6.8 08/02/2020   ALBUMIN 3.9 08/02/2020   BILITOT 1.1 08/02/2020   ALKPHOS 83 08/02/2020   AST 28 08/02/2020   ALT 36 08/02/2020   ANIONGAP 6 08/02/2020   Last  lipids No results found for: CHOL, HDL, LDLCALC, LDLDIRECT, TRIG, CHOLHDL Last hemoglobin A1c No results found for: HGBA1C Last thyroid functions No results found for: TSH, T3TOTAL, T4TOTAL, THYROIDAB Last vitamin D No results found for: 25OHVITD2, 25OHVITD3, VD25OH Last vitamin B12 and Folate No results found for: VITAMINB12, FOLATE    Objective    BP 117/69   Pulse 68   Temp 98.5 F (36.9 C) (Oral)   Resp 16   Ht 5\' 5"  (1.651 m)   Wt 215 lb (97.5 kg)   SpO2 100%   BMI 35.78 kg/m  Physical Exam Vitals and nursing note reviewed.  Constitutional:      Appearance: Normal appearance. She is not ill-appearing.  HENT:     Head: Normocephalic and atraumatic.     Right Ear: Tympanic membrane, ear canal and external ear normal. There is no impacted cerumen.     Left Ear: Tympanic membrane, ear canal and external ear normal. There is no impacted cerumen.     Nose: Nose normal. No congestion or rhinorrhea.     Mouth/Throat:     Mouth: Mucous membranes are moist.  Eyes:     General: No scleral icterus.       Right eye: No discharge.        Left eye: No discharge.     Extraocular Movements: Extraocular movements intact.     Conjunctiva/sclera: Conjunctivae normal.     Pupils: Pupils are  equal, round, and reactive to light.  Cardiovascular:     Rate and Rhythm: Normal rate and regular rhythm.     Pulses: Normal pulses.     Heart sounds: Normal heart sounds. No murmur heard. No friction rub. No gallop.   Pulmonary:     Effort: Pulmonary effort is normal. No respiratory distress.     Breath sounds: Normal breath sounds. No stridor. No wheezing, rhonchi or rales.  Chest:     Chest wall: No tenderness.  Abdominal:     General: Bowel sounds are normal. There is no distension.     Palpations: Abdomen is soft.     Tenderness: There is no abdominal tenderness. There is no guarding.  Genitourinary:    Comments: Deferred.  Musculoskeletal:        General: No tenderness. Normal range of motion.     Cervical back: Normal range of motion and neck supple.     Right lower leg: No edema.     Left lower leg: No edema.  Skin:    General: Skin is warm.     Findings: No erythema, lesion or rash.  Neurological:     General: No focal deficit present.     Mental Status: She is alert and oriented to person, place, and time.     Cranial Nerves: No cranial nerve deficit.     Sensory: No sensory deficit.     Motor: No weakness.     Coordination: Coordination normal.     Gait: Gait normal.     Deep Tendon Reflexes: Reflexes normal.  Psychiatric:        Mood and Affect: Mood normal.        Behavior: Behavior normal.        Thought Content: Thought content normal.        Judgment: Judgment normal.     Depression Screen PHQ 2/9 Scores 08/01/2020  PHQ - 2 Score 2  PHQ- 9 Score 8   No results found for any visits on 08/01/20.  Assessment & Plan  1. Anxiety and depression   - Ambulatory referral to Psychiatry - hydrOXYzine (ATARAX/VISTARIL) 10 MG tablet; Take 1 tablet (10 mg total) by mouth 3 (three) times daily as needed.  Dispense: 30 tablet; Refill: 0  2. Bipolar 1 disorder, depressed (HCC)  - Ambulatory referral to Psychiatry  3. Gastroesophageal reflux disease,  unspecified whether esophagitis present  - TSH - CBC with Differential/Platelet - Comprehensive Metabolic Panel (CMET) - Lipid Panel w/o Chol/HDL Ratio  4. Fatigue, unspecified type  - TSH  5. Body mass index (BMI) of 34.0-34.9 in adult The patient is advised to begin progressive daily aerobic exercise program, follow a low fat, low cholesterol diet, attempt to lose weight, decrease or avoid alcohol intake, reduce exposure to stress, improve dietary compliance, continue current medications and continue current healthy lifestyle patterns.   Orders Placed This Encounter  Procedures  . TSH  . CBC with Differential/Platelet  . Comprehensive Metabolic Panel (CMET)  . Lipid Panel w/o Chol/HDL Ratio  . Ambulatory referral to Psychiatry   Return in about 1 month (around 08/29/2020), or if symptoms worsen or fail to improve, for at any time for any worsening symptoms, Go to Emergency room/ urgent care if worse.     Red Flags discussed. The patient was given clear instructions to go to ER or return to medical center if any red flags develop, symptoms do not improve, worsen or new problems develop. They verbalized understanding.  The entirety of the information documented in the History of Present Illness, Review of Systems and Physical Exam were personally obtained by me. Portions of this information were initially documented by the CMA and reviewed by me for thoroughness and accuracy.      Jairo Ben, FNP  [facility] [phone] (phone) [phone] (fax)  [facility]

## 2020-08-02 ENCOUNTER — Encounter: Payer: Self-pay | Admitting: Emergency Medicine

## 2020-08-02 ENCOUNTER — Other Ambulatory Visit: Payer: Self-pay

## 2020-08-02 ENCOUNTER — Emergency Department: Payer: Medicare Other

## 2020-08-02 ENCOUNTER — Emergency Department
Admission: EM | Admit: 2020-08-02 | Discharge: 2020-08-02 | Disposition: A | Discharge status: Home / Self Care | Payer: Medicare Other | Attending: Emergency Medicine | Admitting: Emergency Medicine

## 2020-08-02 DIAGNOSIS — Z79899 Other long term (current) drug therapy: Secondary | ICD-10-CM | POA: Insufficient documentation

## 2020-08-02 DIAGNOSIS — R1011 Right upper quadrant pain: Secondary | ICD-10-CM | POA: Diagnosis present

## 2020-08-02 DIAGNOSIS — J4520 Mild intermittent asthma, uncomplicated: Secondary | ICD-10-CM | POA: Diagnosis not present

## 2020-08-02 DIAGNOSIS — Z87891 Personal history of nicotine dependence: Secondary | ICD-10-CM | POA: Diagnosis not present

## 2020-08-02 DIAGNOSIS — R1084 Generalized abdominal pain: Secondary | ICD-10-CM

## 2020-08-02 DIAGNOSIS — K529 Noninfective gastroenteritis and colitis, unspecified: Secondary | ICD-10-CM | POA: Insufficient documentation

## 2020-08-02 DIAGNOSIS — Z91010 Allergy to peanuts: Secondary | ICD-10-CM | POA: Insufficient documentation

## 2020-08-02 LAB — COMPREHENSIVE METABOLIC PANEL
ALT: 36 U/L (ref 0–44)
AST: 28 U/L (ref 15–41)
Albumin: 3.9 g/dL (ref 3.5–5.0)
Alkaline Phosphatase: 83 U/L (ref 38–126)
Anion gap: 6 (ref 5–15)
BUN: 10 mg/dL (ref 6–20)
CO2: 25 mmol/L (ref 22–32)
Calcium: 9.1 mg/dL (ref 8.9–10.3)
Chloride: 107 mmol/L (ref 98–111)
Creatinine, Ser: 0.74 mg/dL (ref 0.44–1.00)
GFR, Estimated: >60 mL/min (ref >60)
Glucose, Bld: 122 mg/dL — ABNORMAL HIGH (ref 70–99)
Potassium: 3.5 mmol/L (ref 3.5–5.1)
Sodium: 138 mmol/L (ref 135–145)
Total Bilirubin: 1.1 mg/dL (ref 0.3–1.2)
Total Protein: 6.8 g/dL (ref 6.5–8.1)

## 2020-08-02 LAB — CBC WITH DIFFERENTIAL/PLATELET
Abs Immature Granulocytes: 0.02 10*3/uL (ref 0.00–0.07)
Basophils Absolute: 0 10*3/uL (ref 0.0–0.1)
Basophils Relative: 0 %
Eosinophils Absolute: 0.1 10*3/uL (ref 0.0–0.5)
Eosinophils Relative: 2 %
HCT: 39.2 % (ref 36.0–46.0)
Hemoglobin: 12 g/dL (ref 12.0–15.0)
Immature Granulocytes: 0 %
Lymphocytes Relative: 18 %
Lymphs Abs: 1.1 10*3/uL (ref 0.7–4.0)
MCH: 23.5 pg — ABNORMAL LOW (ref 26.0–34.0)
MCHC: 30.6 g/dL (ref 30.0–36.0)
MCV: 76.9 fL — ABNORMAL LOW (ref 80.0–100.0)
Monocytes Absolute: 0.3 10*3/uL (ref 0.1–1.0)
Monocytes Relative: 5 %
Neutro Abs: 4.6 10*3/uL (ref 1.7–7.7)
Neutrophils Relative %: 75 %
Platelets: 264 10*3/uL (ref 150–400)
RBC: 5.1 MIL/uL (ref 3.87–5.11)
RDW: 13.8 % (ref 11.5–15.5)
WBC: 6.2 10*3/uL (ref 4.0–10.5)
nRBC: 0 % (ref 0.0–0.2)

## 2020-08-02 LAB — POC URINE PREG, ED: Preg Test, Ur: NEGATIVE

## 2020-08-02 LAB — URINALYSIS, COMPLETE (UACMP) WITH MICROSCOPIC
Bilirubin Urine: NEGATIVE
Glucose, UA: NEGATIVE mg/dL
Hgb urine dipstick: NEGATIVE
Ketones, ur: 5 mg/dL — AB
Nitrite: NEGATIVE
Protein, ur: NEGATIVE mg/dL
Specific Gravity, Urine: 1.032 — ABNORMAL HIGH (ref 1.005–1.030)
pH: 6 (ref 5.0–8.0)

## 2020-08-02 LAB — LIPASE, BLOOD: Lipase: 49 U/L (ref 11–51)

## 2020-08-02 MED ORDER — KETOROLAC TROMETHAMINE 30 MG/ML IJ SOLN
30.0000 mg | Freq: Once | INTRAMUSCULAR | Status: AC
  Administered 2020-08-02: 30 mg via INTRAVENOUS
  Filled 2020-08-02: qty 1

## 2020-08-02 MED ORDER — ONDANSETRON 4 MG PO TBDP
4.0000 mg | ORAL_TABLET | Freq: Once | ORAL | Status: AC
  Administered 2020-08-02: 4 mg via ORAL
  Filled 2020-08-02: qty 1

## 2020-08-02 MED ORDER — ONDANSETRON 4 MG PO TBDP: 4.0000 mg | ORAL_TABLET | Freq: Three times a day (TID) | ORAL | 0 refills | Status: DC | PRN

## 2020-08-02 MED ORDER — IOHEXOL 300 MG/ML  SOLN
100.0000 mL | Freq: Once | INTRAMUSCULAR | Status: AC | PRN
  Administered 2020-08-02: 100 mL via INTRAVENOUS

## 2020-08-02 MED ORDER — SODIUM CHLORIDE 0.9 % IV BOLUS
1000.0000 mL | Freq: Once | INTRAVENOUS | Status: AC
  Administered 2020-08-02: 1000 mL via INTRAVENOUS

## 2020-08-02 MED ORDER — ONDANSETRON HCL 4 MG/2ML IJ SOLN
4.0000 mg | Freq: Four times a day (QID) | INTRAMUSCULAR | Status: DC | PRN
  Administered 2020-08-02: 4 mg via INTRAVENOUS
  Filled 2020-08-02: qty 2

## 2020-08-02 NOTE — ED Notes (Signed)
Pt provided phone to contact family. Call bell within reach

## 2020-08-02 NOTE — ED Notes (Signed)
Signature pad not working at this time, pt verbalizes understanding of d/c instructions, denies questions or concerns. Pt in NAD

## 2020-08-02 NOTE — ED Triage Notes (Addendum)
Pt to triage via w/c with no distress noted; pt reports abd pain, N/V/D since last night; pt with hx GI issues

## 2020-08-02 NOTE — ED Provider Notes (Signed)
-----------------------------------------   7:04 AM on 08/02/2020 -----------------------------------------  Blood pressure 128/77, pulse 96, temperature 98.4 F (36.9 C), temperature source Oral, resp. rate 18, height 5\' 5"  (1.651 m), weight 94.3 kg, SpO2 99 %.  Assuming care from Dr. .  In short, Brittney Kline is a 20 y.o. female with a chief complaint of Abdominal Pain .  Refer to the original H&P for additional details.  The current plan of care is to follow-up CT results for abdominal pain, if negative then likely gastroenteritis.  ----------------------------------------- 7:50 AM on 08/02/2020 -----------------------------------------  CT abdomen/pelvis is negative for acute process, fluid in the bowel seems most consistent with a gastroenteritis.  Patient reports feeling better on reassessment and is appropriate for discharge home with Zofran for nausea.  She was counseled to return to the ED for new worsening symptoms, patient agrees with plan.    08/04/2020, MD 08/02/20 [phone]

## 2020-08-02 NOTE — ED Notes (Signed)
Pt states that she has a Hx of GI complaints but her current complaint is different. The pain is constant, began yesterday at approx 2000 and is accompanied by nausea, vomiting and diarrhea. Pt denies fever, but has chills, weakness and lethargy.

## 2020-08-02 NOTE — ED Provider Notes (Signed)
[facility] Emergency Department Provider Note   ____________________________________________   Event Date/Time   First MD Initiated Contact with Patient 08/02/20 [phone]     (approximate)  I have reviewed the triage vital signs and the nursing notes.   HISTORY  Chief Complaint Abdominal Pain    HPI Brittney Kline is a 20 y.o. female with a stated past medical history of bipolar disorder, depression/anxiety, obesity, and GERD who presents for midepigastric and right upper quadrant abdominal pain that began approximately 10 hours prior to arrival and is associated with nausea/vomiting/diarrhea.  Patient describes a tightness in her mid epigastric region that has been stable since onset, 9/10, radiating to the right upper quadrant, and partially relieved with emesis.  Patient states that she has had issues with her GI tract in the past including GERD but states that this pain is different from her usual in both intensity and association with nausea/vomiting/diarrhea.  Patient has tried taking her home reflux medication with no improvement of her symptoms.  Patient currently denies any vision changes, tinnitus, difficulty speaking, facial droop, sore throat, chest pain, shortness of breath, dysuria, or weakness/numbness/paresthesias in any extremity         Past Medical History:  Diagnosis Date  . Allergy   . Anemia   . Anxiety   . Asthma   . Bipolar 1 disorder (HCC)   . Depression   . GERD (gastroesophageal reflux disease)   . Obesity     Patient Active Problem List   Diagnosis Date Noted  . Head ache 08/01/2020  . Neck pain 08/01/2020  . Gastroesophageal reflux disease 08/01/2020  . Shrimp allergy 10/19/2019  . Nexplanon in place 07/16/2019  . Anxiety and depression 04/21/2019  . Stomach acid 10/15/2018  . GBS (group B Streptococcus carrier), +RV culture, currently pregnant 09/19/2018  . Anxiety about dying 05/30/2018  . Mild intermittent asthma  03/18/2018  . Severe episode of recurrent major depressive disorder, with psychotic features (HCC) 03/18/2018  . History of hyperprolactinemia 08/22/2017  . Bipolar 1 disorder, depressed (HCC) 07/31/2017    Past Surgical History:  Procedure Laterality Date  . MANDIBLE SURGERY    . VAGINAL DELIVERY      Prior to Admission medications   Medication Sig Start Date End Date Taking? Authorizing Provider  ondansetron (ZOFRAN ODT) 4 MG disintegrating tablet Take 1 tablet (4 mg total) by mouth every 8 (eight) hours as needed for nausea or vomiting. 08/02/20  Yes Chesley Noon, MD  albuterol (VENTOLIN HFA) 108 (90 Base) MCG/ACT inhaler Inhale into the lungs.    [provider]  cloNIDine (CATAPRES) 0.1 MG tablet  07/31/20   [provider]  Docusate Sodium (DSS) 100 MG CAPS Take 1 capsule by mouth 2 (two) times daily. 07/03/20   [provider]  hydrOXYzine (ATARAX/VISTARIL) 10 MG tablet Take 1 tablet (10 mg total) by mouth 3 (three) times daily as needed. 08/01/20   Flinchum, Eula Fried, FNP  pantoprazole (PROTONIX) 40 MG tablet Take 1 tablet (40 mg total) by mouth daily. 08/01/20 09/30/20  Flinchum, Eula Fried, FNP    Allergies Shellfish allergy and Peanut-containing drug products  Family History  Problem Relation Age of Onset  . Arthritis Mother   . Bipolar disorder Mother   . Bipolar disorder Father   . Diabetes Father   . Cerebral palsy Son   . Cancer Maternal Grandfather   . Anxiety disorder Maternal Grandfather     Social History Social History   Tobacco  Use  . Smoking status: Former Smoker    Types: Cigarettes  . Smokeless tobacco: Never Used  Vaping Use  . Vaping Use: Never used  Substance Use Topics  . Alcohol use: Yes    Alcohol/week: 1.0 standard drink    Types: 1 Glasses of wine per week  . Drug use: Not Currently    Types: Marijuana    Review of Systems Constitutional: No fever/chills Eyes: No visual changes. ENT: No sore  throat. Cardiovascular: Denies chest pain. Respiratory: Denies shortness of breath. Gastrointestinal: Endorses abdominal pain, nausea, vomiting, diarrhea. Genitourinary: Negative for dysuria. Musculoskeletal: Negative for acute arthralgias Skin: Negative for rash. Neurological: Negative for headaches, weakness/numbness/paresthesias in any extremity Psychiatric: Negative for suicidal ideation/homicidal ideation   ____________________________________________   PHYSICAL EXAM:  VITAL SIGNS: ED Triage Vitals  Enc Vitals Group     BP 08/02/20 0323 114/80     Pulse Rate 08/02/20 0323 100     Resp 08/02/20 0323 18     Temp 08/02/20 0323 98.4 F (36.9 C)     Temp Source 08/02/20 0323 Oral     SpO2 08/02/20 0323 99 %     Weight 08/02/20 0323 208 lb (94.3 kg)     Height 08/02/20 0323 5\' 5"  (1.651 m)     Head Circumference --      Peak Flow --      Pain Score 08/02/20 0324 10     Pain Loc --      Pain Edu? --      Excl. in GC? --    Constitutional: Alert and oriented. Well appearing and in no acute distress. Eyes: Conjunctivae are normal. PERRL. Head: Atraumatic. Nose: No congestion/rhinnorhea. Mouth/Throat: Mucous membranes are moist. Neck: No stridor Cardiovascular: Grossly normal heart sounds.  Good peripheral circulation. Respiratory: Normal respiratory effort.  No retractions. Gastrointestinal: Mild tenderness palpation in the midepigastric region.  Soft. No distention. Musculoskeletal: No obvious deformities Neurologic:  Normal speech and language. No gross focal neurologic deficits are appreciated. Skin:  Skin is warm and dry. No rash noted. Psychiatric: Mood and affect are normal. Speech and behavior are normal.  ____________________________________________   LABS (all labs ordered are listed, but only abnormal results are displayed)  Labs Reviewed  CBC WITH DIFFERENTIAL/PLATELET - Abnormal; Notable for the following components:      Result Value   MCV 76.9 (*)     MCH 23.5 (*)    All other components within normal limits  COMPREHENSIVE METABOLIC PANEL - Abnormal; Notable for the following components:   Glucose, Bld 122 (*)    All other components within normal limits  URINALYSIS, COMPLETE (UACMP) WITH MICROSCOPIC - Abnormal; Notable for the following components:   Color, Urine YELLOW (*)    APPearance HAZY (*)    Specific Gravity, Urine 1.032 (*)    Ketones, ur 5 (*)    Leukocytes,Ua SMALL (*)    Bacteria, UA RARE (*)    All other components within normal limits  LIPASE, BLOOD  POC URINE PREG, ED   RADIOLOGY  ED MD interpretation: Pending at the time of transfer of care  Official radiology report(s): CT Abdomen Pelvis W Contrast  Result Date: 08/02/2020 CLINICAL DATA:  20 year old female with suprasternal, epigastric pain. Postprandial nausea vomiting and diarrhea yesterday. EXAM: CT ABDOMEN AND PELVIS WITH CONTRAST TECHNIQUE: Multidetector CT imaging of the abdomen and pelvis was performed using the standard protocol following bolus administration of intravenous contrast. CONTRAST:  36 OMNIPAQUE IOHEXOL 300 MG/ML  SOLN COMPARISON:  Abdominal ultrasound 07/25/2020. FINDINGS: Lower chest: Negative. Hepatobiliary: Negative liver and gallbladder. Pancreas: Negative. Spleen: Negative. Adrenals/Urinary Tract: Normal adrenal glands. Kidneys appear symmetric and within normal limits. Both kidneys have a somewhat anterior facing configuration, normal variant. No nephrolithiasis. No perinephric stranding. Proximal ureters appear decompressed. Diminutive and unremarkable urinary bladder. Occasional pelvic phleboliths, mostly on the left. Stomach/Bowel: Fluid throughout nondilated small bowel in the abdomen and pelvis. Fluid also in some segments of the large bowel including from the cecum to the distal transverse colon, in the rectosigmoid colon. Normal appendix identified on series 2, image 65 and coronal image 38. No convincing focal bowel inflammation.  Unremarkable stomach and duodenum. No free air, free fluid. Vascular/Lymphatic: Major arterial structures appear patent and normal throughout the abdomen and pelvis. Portal venous and central venous structures also appear to be patent. No lymphadenopathy. Reproductive: Within normal limits for age. Other: No pelvic free fluid. Musculoskeletal: Negative. IMPRESSION: 1. Fluid throughout nondilated small and large bowel may indicate generalized enteritis/diarrhea. Normal appendix, no focal bowel inflammation. 2. Otherwise negative CT Abdomen and Pelvis. Electronically Signed   By: Odessa Fleming M.D.   On: 08/02/2020 07:19    ____________________________________________   PROCEDURES  Procedure(s) performed (including Critical Care):  Procedures   ____________________________________________   INITIAL IMPRESSION / ASSESSMENT AND PLAN / ED COURSE  As part of my medical decision making, I reviewed the following data within the electronic MEDICAL RECORD NUMBER Nursing notes reviewed and incorporated, Labs reviewed, EKG interpreted, Old chart reviewed, Radiograph reviewed and Notes from prior ED visits reviewed and incorporated        Patients symptoms not typical for emergent causes of abdominal pain such as, but not limited to, appendicitis, abdominal aortic aneurysm, surgical biliary disease, pancreatitis, SBO, mesenteric ischemia, serious intra-abdominal bacterial illness. Presentation also not typical of gynecologic emergencies such as TOA, Ovarian Torsion, PID. Not Ectopic. Doubt atypical ACS.  Care of this patient will be signed out to the oncoming physician at the end of my shift.  All pertinent patient information conveyed and all questions answered.  All further care and disposition decisions will be made by the oncoming physician.      ____________________________________________   FINAL CLINICAL IMPRESSION(S) / ED DIAGNOSES  Final diagnoses:  Gastroenteritis  Generalized abdominal  pain     ED Discharge Orders         Ordered    ondansetron (ZOFRAN ODT) 4 MG disintegrating tablet  Every 8 hours PRN        08/02/20 0749           Note:  This document was prepared using Dragon voice recognition software and may include unintentional dictation errors.   Merwyn Katos, MD 08/02/20 [phone]

## 2020-08-02 NOTE — ED Notes (Signed)
Patient transported to CT 

## 2020-08-03 ENCOUNTER — Other Ambulatory Visit: Payer: Self-pay | Admitting: Adult Health

## 2020-08-03 ENCOUNTER — Encounter: Payer: Self-pay | Admitting: Adult Health

## 2020-08-03 DIAGNOSIS — K219 Gastro-esophageal reflux disease without esophagitis: Secondary | ICD-10-CM

## 2020-08-03 NOTE — Telephone Encounter (Signed)
   Notes to clinic:  requesting 90 day    Requested Prescriptions  Pending Prescriptions Disp Refills   pantoprazole (PROTONIX) 40 MG tablet [Pharmacy Med Name: PANTOPRAZOLE 40MG  TABLETS] 90 tablet     Sig: TAKE 1 TABLET(40 MG) BY MOUTH DAILY      Gastroenterology: Proton Pump Inhibitors Passed - 08/03/2020  8:10 AM      Passed - Valid encounter within last 12 months    Recent Outpatient Visits           2 days ago Anxiety and depression   [facility] Flinchum, [facility], FNP

## 2020-08-03 NOTE — Patient Instructions (Signed)
 Calorie Counting for Weight Loss Calories are units of energy. Your body needs a certain number of calories from food to keep going throughout the day. When you eat or drink more calories than your body needs, your body stores the extra calories mostly as fat. When you eat or drink fewer calories than your body needs, your body burns fat to get the energy it needs. Calorie counting means keeping track of how many calories you eat and drink each day. Calorie counting can be helpful if you need to lose weight. If you eat fewer calories than your body needs, you should lose weight. Ask your health care provider what a healthy weight is for you. For calorie counting to work, you will need to eat the right number of calories each day to lose a healthy amount of weight per week. A dietitian can help you figure out how many calories you need in a day and will suggest ways to reach your calorie goal.  A healthy amount of weight to lose each week is usually 1-2 lb (0.5-0.9 kg). This usually means that your daily calorie intake should be reduced by 500-750 calories.  Eating 1,200-1,500 calories a day can help most women lose weight.  Eating 1,500-1,800 calories a day can help most men lose weight. What do I need to know about calorie counting? Work with your health care provider or dietitian to determine how many calories you should get each day. To meet your daily calorie goal, you will need to:  Find out how many calories are in each food that you would like to eat. Try to do this before you eat.  Decide how much of the food you plan to eat.  Keep a food log. Do this by writing down what you ate and how many calories it had. To successfully lose weight, it is important to balance calorie counting with a healthy lifestyle that includes regular activity. Where do I find calorie information? The number of calories in a food can be found on a Nutrition Facts label. If a food does not have a Nutrition  Facts label, try to look up the calories online or ask your dietitian for help. Remember that calories are listed per serving. If you choose to have more than one serving of a food, you will have to multiply the calories per serving by the number of servings you plan to eat. For example, the label on a package of bread might say that a serving size is 1 slice and that there are 90 calories in a serving. If you eat 1 slice, you will have eaten 90 calories. If you eat 2 slices, you will have eaten 180 calories.   How do I keep a food log? After each time that you eat, record the following in your food log as soon as possible:  What you ate. Be sure to include toppings, sauces, and other extras on the food.  How much you ate. This can be measured in cups, ounces, or number of items.  How many calories were in each food and drink.  The total number of calories in the food you ate. Keep your food log near you, such as in a pocket-sized notebook or on an app or website on your mobile phone. Some programs will calculate calories for you and show you how many calories you have left to meet your daily goal. What are some portion-control tips?  Know how many calories are in a serving. This   will help you know how many servings you can have of a certain food.  Use a measuring cup to measure serving sizes. You could also try weighing out portions on a kitchen scale. With time, you will be able to estimate serving sizes for some foods.  Take time to put servings of different foods on your favorite plates or in your favorite bowls and cups so you know what a serving looks like.  Try not to eat straight from a food's packaging, such as from a bag or box. Eating straight from the package makes it hard to see how much you are eating and can lead to overeating. Put the amount you would like to eat in a cup or on a plate to make sure you are eating the right portion.  Use smaller plates, glasses, and bowls for  smaller portions and to prevent overeating.  Try not to multitask. For example, avoid watching TV or using your computer while eating. If it is time to eat, sit down at a table and enjoy your food. This will help you recognize when you are full. It will also help you be more mindful of what and how much you are eating. What are tips for following this plan? Reading food labels  Check the calorie count compared with the serving size. The serving size may be smaller than what you are used to eating.  Check the source of the calories. Try to choose foods that are high in protein, fiber, and vitamins, and low in saturated fat, trans fat, and sodium. Shopping  Read nutrition labels while you shop. This will help you make healthy decisions about which foods to buy.  Pay attention to nutrition labels for low-fat or fat-free foods. These foods sometimes have the same number of calories or more calories than the full-fat versions. They also often have added sugar, starch, or salt to make up for flavor that was removed with the fat.  Make a grocery list of lower-calorie foods and stick to it. Cooking  Try to cook your favorite foods in a healthier way. For example, try baking instead of frying.  Use low-fat dairy products. Meal planning  Use more fruits and vegetables. One-half of your plate should be fruits and vegetables.  Include lean proteins, such as chicken, turkey, and fish. Lifestyle Each week, aim to do one of the following:  150 minutes of moderate exercise, such as walking.  75 minutes of vigorous exercise, such as running. General information  Know how many calories are in the foods you eat most often. This will help you calculate calorie counts faster.  Find a way of tracking calories that works for you. Get creative. Try different apps or programs if writing down calories does not work for you. What foods should I eat?  Eat nutritious foods. It is better to have a  nutritious, high-calorie food, such as an avocado, than a food with few nutrients, such as a bag of potato chips.  Use your calories on foods and drinks that will fill you up and will not leave you hungry soon after eating. ? Examples of foods that fill you up are nuts and nut butters, vegetables, lean proteins, and high-fiber foods such as whole grains. High-fiber foods are foods with more than 5 g of fiber per serving.  Pay attention to calories in drinks. Low-calorie drinks include water and unsweetened drinks. The items listed above may not be a complete list of foods and beverages you can   eat. Contact a dietitian for more information.   What foods should I limit? Limit foods or drinks that are not good sources of vitamins, minerals, or protein or that are high in unhealthy fats. These include:  Candy.  Other sweets.  Sodas, specialty coffee drinks, alcohol, and juice. The items listed above may not be a complete list of foods and beverages you should avoid. Contact a dietitian for more information. How do I count calories when eating out?  Pay attention to portions. Often, portions are much larger when eating out. Try these tips to keep portions smaller: ? Consider sharing a meal instead of getting your own. ? If you get your own meal, eat only half of it. Before you start eating, ask for a container and put half of your meal into it. ? When available, consider ordering smaller portions from the menu instead of full portions.  Pay attention to your food and drink choices. Knowing the way food is cooked and what is included with the meal can help you eat fewer calories. ? If calories are listed on the menu, choose the lower-calorie options. ? Choose dishes that include vegetables, fruits, whole grains, low-fat dairy products, and lean proteins. ? Choose items that are boiled, broiled, grilled, or steamed. Avoid items that are buttered, battered, fried, or served with cream sauce. Items  labeled as crispy are usually fried, unless stated otherwise. ? Choose water, low-fat milk, unsweetened iced tea, or other drinks without added sugar. If you want an alcoholic beverage, choose a lower-calorie option, such as a glass of wine or light beer. ? Ask for dressings, sauces, and syrups on the side. These are usually high in calories, so you should limit the amount you eat. ? If you want a salad, choose a garden salad and ask for grilled meats. Avoid extra toppings such as bacon, cheese, or fried items. Ask for the dressing on the side, or ask for olive oil and vinegar or lemon to use as dressing.  Estimate how many servings of a food you are given. Knowing serving sizes will help you be aware of how much food you are eating at restaurants. Where to find more information  Centers for Disease Control and Prevention: www.cdc.gov  [place] Department of Agriculture: myplate.gov Summary  Calorie counting means keeping track of how many calories you eat and drink each day. If you eat fewer calories than your body needs, you should lose weight.  A healthy amount of weight to lose per week is usually 1-2 lb (0.5-0.9 kg). This usually means reducing your daily calorie intake by 500-750 calories.  The number of calories in a food can be found on a Nutrition Facts label. If a food does not have a Nutrition Facts label, try to look up the calories online or ask your dietitian for help.  Use smaller plates, glasses, and bowls for smaller portions and to prevent overeating.  Use your calories on foods and drinks that will fill you up and not leave you hungry shortly after a meal. This information is not intended to replace advice given to you by your health care provider. Make sure you discuss any questions you have with your health care provider. Document Revised: 07/08/2019 Document Reviewed: 07/08/2019 Elsevier Patient Education  2021 [employer]   Fat and Cholesterol Restricted Eating  Plan Getting too much fat and cholesterol in your diet may cause health problems. Choosing the right foods helps keep your fat and cholesterol at normal levels. This   can keep you from getting certain diseases. Your doctor may recommend an eating plan that includes:  Total fat: ______% or less of total calories a day.  Saturated fat: ______% or less of total calories a day.  Cholesterol: less than _________mg a day.  Fiber: ______g a day. What are tips for following this plan? Meal planning  At meals, divide your plate into four equal parts: ? Fill one-half of your plate with vegetables and green salads. ? Fill one-fourth of your plate with whole grains. ? Fill one-fourth of your plate with low-fat (lean) protein foods.  Eat fish that is high in omega-3 fats at least two times a week. This includes mackerel, tuna, sardines, and salmon.  Eat foods that are high in fiber, such as whole grains, beans, apples, broccoli, carrots, peas, and barley. General tips  Work with your doctor to lose weight if you need to.  Avoid: ? Foods with added sugar. ? Fried foods. ? Foods with partially hydrogenated oils.  Limit alcohol intake to no more than 1 drink a day for nonpregnant women and 2 drinks a day for men. One drink equals 12 oz of beer, 5 oz of wine, or 1 oz of hard liquor.   Reading food labels  Check food labels for: ? Trans fats. ? Partially hydrogenated oils. ? Saturated fat (g) in each serving. ? Cholesterol (mg) in each serving. ? Fiber (g) in each serving.  Choose foods with healthy fats, such as: ? Monounsaturated fats. ? Polyunsaturated fats. ? Omega-3 fats.  Choose grain products that have whole grains. Look for the word "whole" as the first word in the ingredient list. Cooking  Cook foods using low-fat methods. These include baking, boiling, grilling, and broiling.  Eat more home-cooked foods. Eat at restaurants and buffets less often.  Avoid cooking using  saturated fats, such as butter, cream, palm oil, palm kernel oil, and coconut oil. Recommended foods Fruits  All fresh, canned (in natural juice), or frozen fruits. Vegetables  Fresh or frozen vegetables (raw, steamed, roasted, or grilled). Green salads. Grains  Whole grains, such as whole wheat or whole grain breads, crackers, cereals, and pasta. Unsweetened oatmeal, bulgur, barley, quinoa, or brown rice. Corn or whole wheat flour tortillas. Meats and other protein foods  Ground beef (85% or leaner), grass-fed beef, or beef trimmed of fat. Skinless chicken or turkey. Ground chicken or turkey. Pork trimmed of fat. All fish and seafood. Egg whites. Dried beans, peas, or lentils. Unsalted nuts or seeds. Unsalted canned beans. Nut butters without added sugar or oil. Dairy  Low-fat or nonfat dairy products, such as skim or 1% milk, 2% or reduced-fat cheeses, low-fat and fat-free ricotta or cottage cheese, or plain low-fat and nonfat yogurt. Fats and oils  Tub margarine without trans fats. Light or reduced-fat mayonnaise and salad dressings. Avocado. Olive, canola, sesame, or safflower oils. The items listed above may not be a complete list of foods and beverages you can eat. Contact a dietitian for more information.   Foods to avoid Fruits  Canned fruit in heavy syrup. Fruit in cream or butter sauce. Fried fruit. Vegetables  Vegetables cooked in cheese, cream, or butter sauce. Fried vegetables. Grains  White bread. White pasta. White rice. Cornbread. Bagels, pastries, and croissants. Crackers and snack foods that contain trans fat and hydrogenated oils. Meats and other protein foods  Fatty cuts of meat. Ribs, chicken wings, bacon, sausage, bologna, salami, chitterlings, fatback, hot dogs, bratwurst, and packaged lunch meats. Liver and   organ meats. Whole eggs and egg yolks. Chicken and turkey with skin. Fried meat. Dairy  Whole or 2% milk, cream, half-and-half, and cream cheese. Whole  milk cheeses. Whole-fat or sweetened yogurt. Full-fat cheeses. Nondairy creamers and whipped toppings. Processed cheese, cheese spreads, and cheese curds. Beverages  Alcohol. Sugar-sweetened drinks such as sodas, lemonade, and fruit drinks. Fats and oils  Butter, stick margarine, lard, shortening, ghee, or bacon fat. Coconut, palm kernel, and palm oils. Sweets and desserts  Corn syrup, sugars, honey, and molasses. Candy. Jam and jelly. Syrup. Sweetened cereals. Cookies, pies, cakes, donuts, muffins, and ice cream. The items listed above may not be a complete list of foods and beverages you should avoid. Contact a dietitian for more information. Summary  Choosing the right foods helps keep your fat and cholesterol at normal levels. This can keep you from getting certain diseases.  At meals, fill one-half of your plate with vegetables and green salads.  Eat high-fiber foods, like whole grains, beans, apples, carrots, peas, and barley.  Limit added sugar, saturated fats, alcohol, and fried foods. This information is not intended to replace advice given to you by your health care provider. Make sure you discuss any questions you have with your health care provider. Document Revised: 09/29/2019 Document Reviewed: 09/29/2019 Elsevier Patient Education  2021 Elsevier Inc.  

## 2020-08-07 ENCOUNTER — Ambulatory Visit (INDEPENDENT_AMBULATORY_CARE_PROVIDER_SITE_OTHER): Payer: Medicare Other | Admitting: Adult Health

## 2020-08-07 DIAGNOSIS — Z5329 Procedure and treatment not carried out because of patient's decision for other reasons: Secondary | ICD-10-CM

## 2020-08-08 ENCOUNTER — Ambulatory Visit: Payer: Medicare Other | Admitting: Physician Assistant

## 2020-08-08 ENCOUNTER — Ambulatory Visit: Payer: Self-pay | Admitting: *Deleted

## 2020-08-08 NOTE — Telephone Encounter (Signed)
Pt called in c  She c/o chest tightness.    I triaged her and felt the appt for today at 3:00 was appropriate after the agent transferred her to me.  See notes below  Reason for Disposition . [1] Chest pain lasting < 5 minutes AND [2] NO chest pain or cardiac symptoms (e.g., breathing difficulty, sweating) now (Exception: chest pains that last only a few seconds)  Answer Assessment - Initial Assessment Questions 1. LOCATION: "Where does it hurt?"       Pt called in to make an appt for a physical and requested an EKG.  She told the agent scheduling the appt she was having a chest tightness and wanted to have her heart checked. She was then transferred to me for triage.   I had the agent go ahead and make the 3:00 appt for today and I could cancel it if necessary.   Looking at her history she has had problems with GERD so I wanted to talk with her further to be sure this wasn't a different pain than her usual GERD symptoms. 2. RADIATION: "Does the pain go anywhere else?" (e.g., into neck, jaw, arms, back)     No radiation.   "It's not really a chest tightness".   "I don't know how to describe it".   She denies shortness of breath, breaking out in a sweat, dizziness.   "I'm not having the not really a pain it's hard to describe sensation this morning".    "I feel fine right now".   No cardiac history.   Has been worked up for GERD. 3. ONSET: "When did the chest pain begin?" (Minutes, hours or days)      It happened last night just a different sensation. 4. PATTERN "Does the pain come and go, or has it been constant since it started?"  "Does it get worse with exertion?"      Intermittent 5. DURATION: "How long does it last" (e.g., seconds, minutes, hours)     Varies 6. SEVERITY: "How bad is the pain?"  (e.g., Scale 1-10; mild, moderate, or severe)    - MILD (1-3): doesn't interfere with normal activities     - MODERATE (4-7): interferes with normal activities or awakens from sleep    - SEVERE  (8-10): excruciating pain, unable to do any normal activities       Mild 7. CARDIAC RISK FACTORS: "Do you have any history of heart problems or risk factors for heart disease?" (e.g., angina, prior heart attack; diabetes, high blood pressure, high cholesterol, smoker, or strong family history of heart disease)     No 8. PULMONARY RISK FACTORS: "Do you have any history of lung disease?"  (e.g., blood clots in lung, asthma, emphysema, birth control pills)     No 9. CAUSE: "What do you think is causing the chest pain?"     I think it's the reflux. 10. OTHER SYMPTOMS: "Do you have any other symptoms?" (e.g., dizziness, nausea, vomiting, sweating, fever, difficulty breathing, cough)       Denies all of above.   Not been sick.   No coughing. 11. PREGNANCY: "Is there any chance you are pregnant?" "When was your last menstrual period?"       Not asked  Protocols used: CHEST PAIN-A-AH

## 2020-08-09 DIAGNOSIS — Z5329 Procedure and treatment not carried out because of patient's decision for other reasons: Secondary | ICD-10-CM | POA: Insufficient documentation

## 2020-08-09 DIAGNOSIS — Z91199 Patient's noncompliance with other medical treatment and regimen due to unspecified reason: Secondary | ICD-10-CM | POA: Insufficient documentation

## 2020-08-09 NOTE — Progress Notes (Signed)
   Complete physical exam  Patient: Brittney Kline   DOB: 03/30/2001   20 y.o. Female  MRN: [id number]  Subjective:    No chief complaint on file.   Brittney Kline is a 20 y.o. female who presents today for a complete physical exam. She reports consuming a {diet types:17450} diet. {types:19826} She generally feels {DESC; WELL/FAIRLY WELL/POORLY:18703}. She reports sleeping {DESC; WELL/FAIRLY WELL/POORLY:18703}. She {does/does not:200015} have additional problems to discuss today.    Most recent fall risk assessment:    12/05/2021   10:42 AM  Fall Risk   Falls in the past year? 0  Number falls in past yr: 0  Injury with Fall? 0  Risk for fall due to : No Fall Risks  Follow up Falls evaluation completed     Most recent depression screenings:    12/05/2021   10:42 AM 10/26/2020   10:46 AM  PHQ 2/9 Scores  PHQ - 2 Score 0 0  PHQ- 9 Score 5     {VISON DENTAL STD PSA (Optional):27386}  {History (Optional):23778}  Patient Care Team: Jessup, Joy, NP as PCP - General (Nurse Practitioner)   Outpatient Medications Prior to Visit  Medication Sig   fluticasone (FLONASE) 50 MCG/ACT nasal spray Place 2 sprays into both nostrils in the morning and at bedtime. After 7 days, reduce to once daily.   norgestimate-ethinyl estradiol (SPRINTEC 28) 0.25-35 MG-MCG tablet Take 1 tablet by mouth daily.   Nystatin POWD Apply liberally to affected area 2 times per day   spironolactone (ALDACTONE) 100 MG tablet Take 1 tablet (100 mg total) by mouth daily.   No facility-administered medications prior to visit.    ROS        Objective:     There were no vitals taken for this visit. {Vitals History (Optional):23777}  Physical Exam   No results found for any visits on 01/10/22. {Show previous labs (optional):23779}    Assessment & Plan:    Routine Health Maintenance and Physical Exam  Immunization History  Administered Date(s) Administered   DTaP 06/13/1999, 08/09/1999,  10/18/1999, 07/03/2000, 01/17/2004   Hepatitis A 11/13/2007, 11/18/2008   Hepatitis B 03/31/1999, 05/08/1999, 10/18/1999   HiB (PRP-OMP) 06/13/1999, 08/09/1999, 10/18/1999, 07/03/2000   IPV 06/13/1999, 08/09/1999, 04/07/2000, 01/17/2004   Influenza,inj,Quad PF,6+ Mos 02/18/2014   Influenza-Unspecified 05/20/2012   MMR 04/07/2001, 01/17/2004   Meningococcal Polysaccharide 11/18/2011   Pneumococcal Conjugate-13 07/03/2000   Pneumococcal-Unspecified 10/18/1999, 01/01/2000   Tdap 11/18/2011   Varicella 04/07/2000, 11/13/2007    Health Maintenance  Topic Date Due   HIV Screening  Never done   Hepatitis C Screening  Never done   INFLUENZA VACCINE  01/08/2022   PAP-Cervical Cytology Screening  01/10/2022 (Originally 03/29/2020)   PAP SMEAR-Modifier  01/10/2022 (Originally 03/29/2020)   TETANUS/TDAP  01/10/2022 (Originally 11/17/2021)   HPV VACCINES  Discontinued   COVID-19 Vaccine  Discontinued    Discussed health benefits of physical activity, and encouraged her to engage in regular exercise appropriate for her age and condition.  Problem List Items Addressed This Visit   None Visit Diagnoses     Annual physical exam    -  Primary   Cervical cancer screening       Need for Tdap vaccination          No follow-ups on file.     Joy Jessup, NP   

## 2020-08-23 ENCOUNTER — Ambulatory Visit (INDEPENDENT_AMBULATORY_CARE_PROVIDER_SITE_OTHER): Payer: Medicare Other | Admitting: Physician Assistant

## 2020-08-23 ENCOUNTER — Other Ambulatory Visit: Payer: Self-pay

## 2020-08-23 VITALS — BP 114/64 | HR 94 | Temp 98.3°F | Ht 65.0 in | Wt 208.0 lb

## 2020-08-23 DIAGNOSIS — R0602 Shortness of breath: Secondary | ICD-10-CM | POA: Diagnosis not present

## 2020-08-23 DIAGNOSIS — I498 Other specified cardiac arrhythmias: Secondary | ICD-10-CM

## 2020-08-23 DIAGNOSIS — R0789 Other chest pain: Secondary | ICD-10-CM | POA: Diagnosis not present

## 2020-08-23 DIAGNOSIS — F319 Bipolar disorder, unspecified: Secondary | ICD-10-CM

## 2020-08-23 NOTE — Patient Instructions (Signed)
[facility]: Phone: [phone]   Palpitations Palpitations are feelings that your heartbeat is irregular or is faster than normal. It may feel like your heart is fluttering or skipping a beat. Palpitations are usually not a serious problem. They may be caused by many things, including smoking, caffeine, alcohol, stress, and certain medicines or drugs. Most causes of palpitations are not serious. However, some palpitations can be a sign of a serious problem. You may need further tests to rule out serious medical problems. Follow these instructions at home: Pay attention to any changes in your condition. Take these actions to help manage your symptoms: Eating and drinking  Avoid foods and drinks that may cause palpitations. These may include: ? Caffeinated coffee, tea, soft drinks, diet pills, and energy drinks. ? Chocolate. ? Alcohol. Lifestyle  Take steps to reduce your stress and anxiety. Things that can help you relax include: ? Yoga. ? Mind-body activities, such as deep breathing, meditation, or using words and images to create positive thoughts (guided imagery). ? Physical activity, such as swimming, jogging, or walking. Tell your health care provider if your palpitations increase with activity. If you have chest pain or shortness of breath with activity, do not continue the activity until you are seen by your health care provider. ? Biofeedback. This is a method that helps you learn to use your mind to control things in your body, such as your heartbeat.  Do not use drugs, including cocaine or ecstasy. Do not use marijuana.  Get plenty of rest and sleep. Keep a regular bed time. General instructions  Take over-the-counter and prescription medicines only as told by your health care provider.  Do not use any products that contain nicotine or tobacco, such as cigarettes and e-cigarettes. If you need help quitting, ask your health care provider.  Keep all follow-up visits as  told by your health care provider. This is important. These may include visits for further testing if palpitations do not go away or get worse.      Contact a health care provider if you:  Continue to have a fast or irregular heartbeat after 24 hours.  Notice that your palpitations occur more often. Get help right away if you:  Have chest pain or shortness of breath.  Have a severe headache.  Feel dizzy or you faint. Summary  Palpitations are feelings that your heartbeat is irregular or is faster than normal. It may feel like your heart is fluttering or skipping a beat.  Palpitations may be caused by many things, including smoking, caffeine, alcohol, stress, certain medicines, and drugs.  Although most causes of palpitations are not serious, some causes can be a sign of a serious medical problem.  Get help right away if you faint or have chest pain, shortness of breath, a severe headache, or dizziness. This information is not intended to replace advice given to you by your health care provider. Make sure you discuss any questions you have with your health care provider. Document Revised: 07/09/2017 Document Reviewed: 07/09/2017 Elsevier Patient Education  2021 [employer].

## 2020-08-23 NOTE — Progress Notes (Signed)
Acute Office Visit  Subjective:    Patient ID: Brittney Kline, female    DOB: 07/12/2000, 20 y.o.   MRN: [id number]  Chief Complaint  Patient presents with  . Shortness of Breath  . Chest Pain    HPI Patient is in today for sob, chest tightness. Pt says this has been going on for a few months. She reports this has been something that comes and goes, and has been ongoing for over a few years. She has had multiple EKGs that show a sinus arrhythmia.   Past Medical History:  Diagnosis Date  . Allergy   . Anemia   . Anxiety   . Asthma   . Bipolar 1 disorder (HCC)   . Depression   . GERD (gastroesophageal reflux disease)   . Obesity     Past Surgical History:  Procedure Laterality Date  . MANDIBLE SURGERY    . VAGINAL DELIVERY      Family History  Problem Relation Age of Onset  . Arthritis Mother   . Bipolar disorder Mother   . Bipolar disorder Father   . Diabetes Father   . Cerebral palsy Son   . Cancer Maternal Grandfather   . Anxiety disorder Maternal Grandfather     Social History   Socioeconomic History  . Marital status: Single    Spouse name: Not on file  . Number of children: Not on file  . Years of education: Not on file  . Highest education level: Not on file  Occupational History  . Not on file  Tobacco Use  . Smoking status: Former Smoker    Types: Cigarettes  . Smokeless tobacco: Never Used  Vaping Use  . Vaping Use: Never used  Substance and Sexual Activity  . Alcohol use: Yes    Alcohol/week: 1.0 standard drink    Types: 1 Glasses of wine per week  . Drug use: Not Currently    Types: Marijuana  . Sexual activity: Yes    Birth control/protection: Injection  Other Topics Concern  . Not on file  Social History Narrative  . Not on file   Social Determinants of Health   Financial Resource Strain: Not on file  Food Insecurity: Not on file  Transportation Needs: Not on file  Physical Activity: Not on file  Stress: Not on file  Social  Connections: Not on file  Intimate Partner Violence: Not on file    Outpatient Medications Prior to Visit  Medication Sig Dispense Refill  . albuterol (VENTOLIN HFA) 108 (90 Base) MCG/ACT inhaler Inhale into the lungs.    . cloNIDine (CATAPRES) 0.1 MG tablet     . Docusate Sodium (DSS) 100 MG CAPS Take 1 capsule by mouth 2 (two) times daily.    . hydrOXYzine (ATARAX/VISTARIL) 10 MG tablet Take 1 tablet (10 mg total) by mouth 3 (three) times daily as needed. 30 tablet 0  . ondansetron (ZOFRAN ODT) 4 MG disintegrating tablet Take 1 tablet (4 mg total) by mouth every 8 (eight) hours as needed for nausea or vomiting. 12 tablet 0  . pantoprazole (PROTONIX) 40 MG tablet TAKE 1 TABLET(40 MG) BY MOUTH DAILY (Patient taking differently: Take 80 mg by mouth daily.) 90 tablet 1   No facility-administered medications prior to visit.    Allergies  Allergen Reactions  . Shellfish Allergy Anaphylaxis  . Peanut-Containing Drug Products Other (See Comments)    Review of Systems  Constitutional: Positive for fatigue.  HENT:       Headache  Respiratory: Positive for chest tightness and shortness of breath.   Cardiovascular: Positive for palpitations. Negative for chest pain and leg swelling.       Objective:    Physical Exam Vitals reviewed.  Constitutional:      General: She is not in acute distress.    Appearance: Normal appearance. She is well-developed. She is obese. She is not ill-appearing or diaphoretic.  HENT:     Head: Normocephalic and atraumatic.  Neck:     Thyroid: No thyromegaly.     Vascular: No JVD.     Trachea: No tracheal deviation.  Cardiovascular:     Rate and Rhythm: Normal rate. Rhythm regularly irregular.     Pulses: Normal pulses.     Heart sounds: Normal heart sounds. No murmur heard. No friction rub. No gallop.   Pulmonary:     Effort: Pulmonary effort is normal. No respiratory distress.     Breath sounds: Normal breath sounds. No wheezing or rales.   Musculoskeletal:     Cervical back: Normal range of motion and neck supple.     Right lower leg: No edema.     Left lower leg: No edema.  Lymphadenopathy:     Cervical: No cervical adenopathy.  Neurological:     Mental Status: She is alert.     BP 114/64 (BP Location: Right Arm, Patient Position: Sitting, Cuff Size: Normal)   Pulse 94   Temp 98.3 F (36.8 C) (Oral)   Ht 5\' 5"  (1.651 m)   Wt 208 lb (94.3 kg)   SpO2 100%   BMI 34.61 kg/m  Wt Readings from Last 3 Encounters:  08/23/20 208 lb (94.3 kg)  08/02/20 208 lb (94.3 kg)  08/01/20 215 lb (97.5 kg)    Health Maintenance Due  Topic Date Due  . Hepatitis C Screening  Never done  . COVID-19 Vaccine (1) Never done  . HPV VACCINES (1 - 2-dose series) Never done  . HIV Screening  Never done  . PAP-Cervical Cytology Screening  Never done  . PAP SMEAR-Modifier  Never done       Topic Date Due  . HPV VACCINES (1 - 2-dose series) Never done     Lab Results  Component Value Date   TSH 1.510 08/23/2020   Lab Results  Component Value Date   WBC 6.6 08/23/2020   HGB 12.1 08/23/2020   HCT 38.9 08/23/2020   MCV 78 (L) 08/23/2020   PLT 340 08/23/2020   Lab Results  Component Value Date   NA 141 08/23/2020   K 4.2 08/23/2020   CO2 20 08/23/2020   GLUCOSE 92 08/23/2020   BUN 14 08/23/2020   CREATININE 0.82 08/23/2020   BILITOT 0.2 08/23/2020   ALKPHOS 97 08/23/2020   AST 19 08/23/2020   ALT 22 08/23/2020   PROT 5.9 (L) 08/23/2020   ALBUMIN 4.1 08/23/2020   CALCIUM 9.5 08/23/2020   ANIONGAP 6 08/02/2020   Lab Results  Component Value Date   CHOL 184 08/23/2020   Lab Results  Component Value Date   HDL 55 08/23/2020   Lab Results  Component Value Date   LDLCALC 114 (H) 08/23/2020   Lab Results  Component Value Date   TRIG 82 08/23/2020   No results found for: CHOLHDL No results found for: 08/25/2020     Assessment & Plan:   1. SOB (shortness of breath) Patient with known sinus arrhythmia. Has  never been worked up. Will refer to cardiolgy as below for  further evaluation since patient is symptomatic from arrhythmia. Patient agrees with plan.  - Ambulatory referral to Cardiology  2. Chest tightness See above medical treatment plan. - Ambulatory referral to Cardiology  3. Bipolar 1 disorder, depressed (HCC) Known issue. Medications may be contributing vs anxiety contributing to symptoms noted above. Would recommend evaluation to r/o other cause for symptoms.  4. Sinus arrhythmia seen on electrocardiogram Noted on all previousl EKGs. Appears stable, but patient also having symptoms of SOB, DOE, chest tightness. See above medical treatment plan. - Ambulatory referral to Cardiology   Margaretann Loveless, PA-C

## 2020-08-24 LAB — COMPREHENSIVE METABOLIC PANEL
ALT: 22 IU/L (ref 0–32)
AST: 19 IU/L (ref 0–40)
Albumin/Globulin Ratio: 2.3 — ABNORMAL HIGH (ref 1.2–2.2)
Albumin: 4.1 g/dL (ref 3.9–5.0)
Alkaline Phosphatase: 97 IU/L (ref 44–121)
BUN/Creatinine Ratio: 17 (ref 9–23)
BUN: 14 mg/dL (ref 6–20)
Bilirubin Total: 0.2 mg/dL (ref 0.0–1.2)
CO2: 20 mmol/L (ref 20–29)
Calcium: 9.5 mg/dL (ref 8.7–10.2)
Chloride: 106 mmol/L (ref 96–106)
Creatinine, Ser: 0.82 mg/dL (ref 0.57–1.00)
Globulin, Total: 1.8 g/dL (ref 1.5–4.5)
Glucose: 92 mg/dL (ref 65–99)
Potassium: 4.2 mmol/L (ref 3.5–5.2)
Sodium: 141 mmol/L (ref 134–144)
Total Protein: 5.9 g/dL — ABNORMAL LOW (ref 6.0–8.5)
eGFR: 104 mL/min/{1.73_m2} (ref >59)

## 2020-08-24 LAB — CBC WITH DIFFERENTIAL/PLATELET
Basophils Absolute: 0 10*3/uL (ref 0.0–0.2)
Basos: 0 %
EOS (ABSOLUTE): 0.2 10*3/uL (ref 0.0–0.4)
Eos: 3 %
Hematocrit: 38.9 % (ref 34.0–46.6)
Hemoglobin: 12.1 g/dL (ref 11.1–15.9)
Immature Grans (Abs): 0 10*3/uL (ref 0.0–0.1)
Immature Granulocytes: 0 %
Lymphocytes Absolute: 2.6 10*3/uL (ref 0.7–3.1)
Lymphs: 40 %
MCH: 24.1 pg — ABNORMAL LOW (ref 26.6–33.0)
MCHC: 31.1 g/dL — ABNORMAL LOW (ref 31.5–35.7)
MCV: 78 fL — ABNORMAL LOW (ref 79–97)
Monocytes Absolute: 0.3 10*3/uL (ref 0.1–0.9)
Monocytes: 5 %
Neutrophils Absolute: 3.5 10*3/uL (ref 1.4–7.0)
Neutrophils: 52 %
Platelets: 340 10*3/uL (ref 150–450)
RBC: 5.02 x10E6/uL (ref 3.77–5.28)
RDW: 13.8 % (ref 11.7–15.4)
WBC: 6.6 10*3/uL (ref 3.4–10.8)

## 2020-08-24 LAB — LIPID PANEL W/O CHOL/HDL RATIO
Cholesterol, Total: 184 mg/dL (ref 100–199)
HDL: 55 mg/dL (ref >39)
LDL Chol Calc (NIH): 114 mg/dL — ABNORMAL HIGH (ref 0–99)
Triglycerides: 82 mg/dL (ref 0–149)
VLDL Cholesterol Cal: 15 mg/dL (ref 5–40)

## 2020-08-24 LAB — TSH: TSH: 1.51 u[IU]/mL (ref 0.450–4.500)

## 2020-08-24 NOTE — Progress Notes (Signed)
TSH within normal limits.  CBC within normal limits- do reccommended a multivitamin with iron daily per package instructions. Recheck CBC in 6 months.   Glucose is elevated - add on hemoglobin A1c if able to labs to rule out diabetes.  Recommend dietary changes and increased exercise.   LDL elevated.  Discuss lifestyle modification with patient e.g. increase exercise, fiber, fruits, vegetables, lean meat, and omega 3/fish intake and decrease saturated fat.  If patient following strict diet and exercise program already please schedule follow up appointment with primary care physician

## 2020-08-27 ENCOUNTER — Encounter: Payer: Self-pay | Admitting: Physician Assistant

## 2020-09-02 ENCOUNTER — Encounter: Payer: Self-pay | Admitting: Adult Health

## 2020-09-04 ENCOUNTER — Telehealth: Payer: Self-pay | Admitting: Adult Health

## 2020-09-04 NOTE — Telephone Encounter (Signed)
Pt called saying she needs a new rx for her Ventolin inhaler.  She said she ran out last night.  She also had questions about labs asking if she has diabetes. [facility] in [place]  CB#  [phone]

## 2020-09-05 ENCOUNTER — Other Ambulatory Visit: Payer: Self-pay | Admitting: Adult Health

## 2020-09-05 DIAGNOSIS — R739 Hyperglycemia, unspecified: Secondary | ICD-10-CM

## 2020-09-05 MED ORDER — ALBUTEROL SULFATE HFA 108 (90 BASE) MCG/ACT IN AERS: 1.0000 | INHALATION_SPRAY | Freq: Four times a day (QID) | RESPIRATORY_TRACT | 1 refills | Status: DC | PRN

## 2020-09-05 NOTE — Progress Notes (Signed)
Meds ordered this encounter  Medications  . albuterol (VENTOLIN HFA) 108 (90 Base) MCG/ACT inhaler    Sig: Inhale 1-2 puffs into the lungs every 6 (six) hours as needed for wheezing or shortness of breath.    Dispense:  18 g    Refill:  1  refill request sent 09/05/20.

## 2020-09-05 NOTE — Telephone Encounter (Signed)
Ventolin sent as requested with one additional refil. No diabetes glucose was 92.

## 2020-09-18 ENCOUNTER — Encounter: Payer: Self-pay | Admitting: Cardiology

## 2020-09-18 ENCOUNTER — Ambulatory Visit (INDEPENDENT_AMBULATORY_CARE_PROVIDER_SITE_OTHER): Payer: Medicare Other | Admitting: Cardiology

## 2020-09-18 ENCOUNTER — Other Ambulatory Visit: Payer: Self-pay

## 2020-09-18 VITALS — BP 110/72 | HR 65 | Ht 64.0 in | Wt 209.0 lb

## 2020-09-18 DIAGNOSIS — R0602 Shortness of breath: Secondary | ICD-10-CM | POA: Diagnosis not present

## 2020-09-18 DIAGNOSIS — F17200 Nicotine dependence, unspecified, uncomplicated: Secondary | ICD-10-CM | POA: Diagnosis not present

## 2020-09-18 DIAGNOSIS — I498 Other specified cardiac arrhythmias: Secondary | ICD-10-CM | POA: Diagnosis not present

## 2020-09-18 NOTE — Progress Notes (Signed)
Cardiology Office Note:    Date:  09/18/2020   ID:  Brittney Kline, DOB 06/06/2001, MRN [id number]  PCP:  Berniece Pap, FNP   [facility]  Cardiologist:  Debbe Odea, MD  Advanced Practice Provider:  No care team member to display Electrophysiologist:  None       Referring MD: Suella Grove*   Chief Complaint  Patient presents with  . Other    Referred by PCP for SOB, Chest tightness, Sinus arrhythmia seen on electrocardiogram. Med reviewed verbally with patient.     History of Present Illness:    Brittney Kline is a 20 y.o. female with a hx of anxiety, current smoker x1 month who presents due to shortness of breath and sinus arrhythmia on EKG.  Patient states having nonspecific shortness of breath over the past month.  Symptoms are not related with exertion.  She denies chest pain or palpitations.  Also endorsed smoking for the past month.  Denies any history of heart disease.  She has a history of asthma, uses as needed albuterol.  This helps sometimes.  Also has a history of anxiety/panic attacks.  She was told over 5 years ago that she had a sinus arrhythmia.  She is worried about this.  Past Medical History:  Diagnosis Date  . Allergy   . Anemia   . Anxiety   . Asthma   . Bipolar 1 disorder (HCC)   . Depression   . GERD (gastroesophageal reflux disease)   . Obesity     Past Surgical History:  Procedure Laterality Date  . MANDIBLE SURGERY    . VAGINAL DELIVERY      Current Medications: No outpatient medications have been marked as taking for the 09/18/20 encounter (Office Visit) with Debbe Odea, MD.     Allergies:   Shellfish allergy and Peanut-containing drug products   Social History   Socioeconomic History  . Marital status: Single    Spouse name: Not on file  . Number of children: Not on file  . Years of education: Not on file  . Highest education level: Not on file  Occupational History  . Not on  file  Tobacco Use  . Smoking status: Former Smoker    Types: Cigarettes  . Smokeless tobacco: Never Used  Vaping Use  . Vaping Use: Never used  Substance and Sexual Activity  . Alcohol use: Yes    Alcohol/week: 1.0 standard drink    Types: 1 Glasses of wine per week  . Drug use: Not Currently    Types: Marijuana  . Sexual activity: Yes    Birth control/protection: Injection  Other Topics Concern  . Not on file  Social History Narrative  . Not on file   Social Determinants of Health   Financial Resource Strain: Not on file  Food Insecurity: Not on file  Transportation Needs: Not on file  Physical Activity: Not on file  Stress: Not on file  Social Connections: Not on file     Family History: The patient's family history includes Anxiety disorder in her maternal grandfather; Arthritis in her mother; Bipolar disorder in her father and mother; Cancer in her maternal grandfather; Cerebral palsy in her son; Diabetes in her father.  ROS:   Please see the history of present illness.     All other systems reviewed and are negative.  EKGs/Labs/Other Studies Reviewed:    The following studies were reviewed today:   EKG:  EKG is  ordered  today.  The ekg ordered today demonstrates sinus rhythm, with sinus arrhythmia, otherwise normal ECG.  Recent Labs: 08/23/2020: ALT 22; BUN 14; Creatinine, Ser 0.82; Hemoglobin 12.1; Platelets 340; Potassium 4.2; Sodium 141; TSH 1.510  Recent Lipid Panel    Component Value Date/Time   CHOL 184 08/23/2020 1557   TRIG 82 08/23/2020 1557   HDL 55 08/23/2020 1557   LDLCALC 114 (H) 08/23/2020 1557     Risk Assessment/Calculations:      Physical Exam:    VS:  BP 110/72 (BP Location: Left Arm, Patient Position: Sitting, Cuff Size: Normal)   Pulse 65   Ht 5\' 4"  (1.626 m)   Wt 209 lb (94.8 kg)   SpO2 98%   BMI 35.87 kg/m     Wt Readings from Last 3 Encounters:  09/18/20 209 lb (94.8 kg)  08/23/20 208 lb (94.3 kg)  08/02/20 208 lb  (94.3 kg)     GEN:  Well nourished, well developed in no acute distress HEENT: Normal NECK: No JVD; No carotid bruits LYMPHATICS: No lymphadenopathy CARDIAC: RRR, no murmurs, rubs, gallops RESPIRATORY:  Clear to auscultation without rales, wheezing or rhonchi  ABDOMEN: Soft, non-tender, non-distended MUSCULOSKELETAL:  No edema; No deformity  SKIN: Warm and dry NEUROLOGIC:  Alert and oriented x 3 PSYCHIATRIC:  Normal affect   ASSESSMENT:    1. Shortness of breath   2. Smoking   3. Sinus arrhythmia    PLAN:    In order of problems listed above:  1. Nonspecific shortness of breath, obesity, deconditioning, panic attacks/asthma could be contributing.  This does not appear to be an anginal equivalent.  She has no significant cardiac risk factors.  Will obtain echocardiogram to evaluate any structural abnormalities.  Patient advised to adequately manage asthma, anxiety. 2. Current smoker, smoking cessation advised. 3. EKG with sinus arrhythmia, otherwise normal.  Patient educated on benign nature of EKG.  She was reassured.  Follow-up after echocardiogram.  Medication Adjustments/Labs and Tests Ordered: Current medicines are reviewed at length with the patient today.  Concerns regarding medicines are outlined above.  Orders Placed This Encounter  Procedures  . EKG 12-Lead  . ECHOCARDIOGRAM COMPLETE   No orders of the defined types were placed in this encounter.   Patient Instructions  Medication Instructions:  Your physician recommends that you continue on your current medications as directed. Please refer to the Current Medication list given to you today.  *If you need a refill on your cardiac medications before your next appointment, please call your pharmacy*   Lab Work: None ordered If you have labs (blood work) drawn today and your tests are completely normal, you will receive your results only by: 08/04/20 MyChart Message (if you have MyChart) OR . A paper copy in the  mail If you have any lab test that is abnormal or we need to change your treatment, we will call you to review the results.   Testing/Procedures:  1.  Your physician has requested that you have an echocardiogram. Echocardiography is a painless test that uses sound waves to create images of your heart. It provides your doctor with information about the size and shape of your heart and how well your heart's chambers and valves are working. This procedure takes approximately one hour. There are no restrictions for this procedure.     Follow-Up: At [facility], you and your health needs are our priority.  As part of our continuing mission to provide you with exceptional heart care, we have  created designated Provider Care Teams.  These Care Teams include your primary Cardiologist (physician) and Advanced Practice Providers (APPs -  Physician Assistants and Nurse Practitioners) who all work together to provide you with the care you need, when you need it.  We recommend signing up for the patient portal called "MyChart".  Sign up information is provided on this After Visit Summary.  MyChart is used to connect with patients for Virtual Visits (Telemedicine).  Patients are able to view lab/test results, encounter notes, upcoming appointments, etc.  Non-urgent messages can be sent to your provider as well.   To learn more about what you can do with MyChart, go to [email or web address].    Your next appointment:   Follow up after Echo   The format for your next appointment:   In Person  Provider:   Debbe Odea, MD   Other Instructions      Signed, Debbe Odea, MD  09/18/2020 12:29 PM    [facility]

## 2020-09-18 NOTE — Patient Instructions (Signed)

## 2020-09-22 ENCOUNTER — Other Ambulatory Visit: Payer: Medicare Other

## 2020-09-26 ENCOUNTER — Telehealth (INDEPENDENT_AMBULATORY_CARE_PROVIDER_SITE_OTHER): Payer: Medicare Other | Admitting: Adult Health

## 2020-09-26 ENCOUNTER — Encounter: Payer: Self-pay | Admitting: Adult Health

## 2020-09-26 ENCOUNTER — Ambulatory Visit: Payer: Self-pay | Admitting: *Deleted

## 2020-09-26 DIAGNOSIS — R059 Cough, unspecified: Secondary | ICD-10-CM

## 2020-09-26 DIAGNOSIS — H6690 Otitis media, unspecified, unspecified ear: Secondary | ICD-10-CM

## 2020-09-26 MED ORDER — BENZONATATE 100 MG PO CAPS: 100.0000 mg | ORAL_CAPSULE | Freq: Two times a day (BID) | ORAL | 0 refills | Status: DC | PRN

## 2020-09-26 MED ORDER — AMOXICILLIN-POT CLAVULANATE 875-125 MG PO TABS: 1.0000 | ORAL_TABLET | Freq: Two times a day (BID) | ORAL | 0 refills | Status: DC

## 2020-09-26 MED ORDER — CETIRIZINE HCL 10 MG PO TABS: 10.0000 mg | ORAL_TABLET | Freq: Every day | ORAL | 1 refills | Status: AC

## 2020-09-26 NOTE — Telephone Encounter (Signed)
Patient is calling to report that she has had cough with blood flecks, sore throat with ear pain- R sided for almost 1 week. COVID testing - negative yesterday. Patient is calling to request appointment for her symptoms. ( She was seen at [facility]- reports COVID test, throat swab done- did not help her with her symptoms.  Reason for Disposition . Earache is present  Answer Assessment - Initial Assessment Questions 1. ONSET: "When did the cough begin?"      Wednesday of last week 2. SEVERITY: "How bad is the cough today?"      Better than Wednesday- less frequent- every 30-60 minutes 3. SPUTUM: "Describe the color of your sputum" (none, dry cough; clear, white, yellow, green)     yellow 4. HEMOPTYSIS: "Are you coughing up any blood?" If so ask: "How much?" (flecks, streaks, tablespoons, etc.)     Flecks- mixed throughout all sputum- started seeing blood Friday- no change in amount seeing 5. DIFFICULTY BREATHING: "Are you having difficulty breathing?" If Yes, ask: "How bad is it?" (e.g., mild, moderate, severe)    - MILD: No SOB at rest, mild SOB with walking, speaks normally in sentences, can lay down, no retractions, pulse < 100.    - MODERATE: SOB at rest, SOB with minimal exertion and prefers to sit, cannot lie down flat, speaks in phrases, mild retractions, audible wheezing, pulse 100-120.    - SEVERE: Very SOB at rest, speaks in single words, struggling to breathe, sitting hunched forward, retractions, pulse > 120      No breathing problems 6. FEVER: "Do you have a fever?" If Yes, ask: "What is your temperature, how was it measured, and when did it start?"     No fever- maybe day 1  7. CARDIAC HISTORY: "Do you have any history of heart disease?" (e.g., heart attack, congestive heart failure)      No 8. LUNG HISTORY: "Do you have any history of lung disease?"  (e.g., pulmonary embolus, asthma, emphysema)     asthma 9. PE RISK FACTORS: "Do you have a history of blood clots?" (or: recent major  surgery, recent prolonged travel, bedridden)     no 10. OTHER SYMPTOMS: "Do you have any other symptoms?" (e.g., runny nose, wheezing, chest pain)       Sore throat/ear canal- R side 11. PREGNANCY: "Is there any chance you are pregnant?" "When was your last menstrual period?"       No- LMP- UPT yesterday neg 12. TRAVEL: "Have you traveled out of the country in the last month?" (e.g., travel history, exposures)      Cousin was sick  Protocols used: COUGH - ACUTE NON-PRODUCTIVE-A-AH

## 2020-09-26 NOTE — Progress Notes (Addendum)
MyChart Video Visit    Virtual Visit via Video Note   This visit type was conducted due to national recommendations for restrictions regarding the COVID-19 Pandemic (e.g. social distancing) in an effort to limit this patient's exposure and mitigate transmission in our community. This patient is at least at moderate risk for complications without adequate follow up. This format is felt to be most appropriate for this patient at this time. Physical exam was limited by quality of the video and audio technology used for the visit.  Parties involved in visit as below:   Patient location: at home  Provider location: Provider: Provider's office at  [facility] [place], [place].     I discussed the limitations of evaluation and management by telemedicine and the availability of in person appointments. The patient expressed understanding and agreed to proceed.  Patient: Brittney Kline   DOB: 2001-02-28   20 y.o. Female  MRN: [id number] Visit Date: 09/26/2020  Today's healthcare provider: Jairo Ben, FNP   Chief Complaint  Patient presents with  . Cough   Subjective       Cough This is a new problem. The current episode started in the past 7 days. The problem has been gradually improving. The problem occurs every few hours. The cough is productive of blood-tinged sputum (post nasal drip blood tinged specks with yellow mucous. ). Associated symptoms include ear congestion, ear pain, nasal congestion, postnasal drip, rhinorrhea, a sore throat and sweats. Pertinent negatives include no chest pain, fever, headaches, heartburn, shortness of breath, weight loss or wheezing. The symptoms are aggravated by pollens.   Painful swallowing right side ear pain as well. Denies any problems swallowing.  Patient  denies any fever, body aches,chills, rash, chest pain, shortness of breath, nausea, vomiting, or diarrhea.  Denies dizziness, lightheadedness, pre syncopal or syncopal  episodes.      Medications: No outpatient medications prior to visit.   No facility-administered medications prior to visit.    Review of Systems  Constitutional: Negative for fever and weight loss.  HENT: Positive for ear pain, postnasal drip, rhinorrhea and sore throat.   Respiratory: Positive for cough. Negative for shortness of breath and wheezing.   Cardiovascular: Negative for chest pain.  Gastrointestinal: Negative for heartburn.  Neurological: Negative for headaches.      Objective    There were no vitals taken for this visit.   Physical Exam   Patient is alert and oriented and responsive to questions Engages in conversation with provider. Speaks in full sentences without any pauses without any shortness of breath or distress.    Assessment & Plan     Cough  Acute otitis media, unspecified otitis media type  Meds ordered this encounter  Medications  . amoxicillin-clavulanate (AUGMENTIN) 875-125 MG tablet    Sig: Take 1 tablet by mouth 2 (two) times daily.    Dispense:  20 tablet    Refill:  0  . benzonatate (TESSALON) 100 MG capsule    Sig: Take 1 capsule (100 mg total) by mouth 2 (two) times daily as needed for cough.    Dispense:  30 capsule    Refill:  0  . cetirizine (ZYRTEC ALLERGY) 10 MG tablet    Sig: Take 1 tablet (10 mg total) by mouth daily.    Dispense:  30 tablet    Refill:  1  nasal saline and also starting Zyrtec 10 mg daily advised for nasal allergies.   Return in about 3 days (around  09/29/2020), or if symptoms worsen or fail to improve, for at any time for any worsening symptoms, Go to Emergency room/ urgent care if worse.     I discussed the assessment and treatment plan with the patient. The patient was provided an opportunity to ask questions and all were answered. The patient agreed with the plan and demonstrated an understanding of the instructions.   The patient was advised to call back or seek an in-person evaluation if the  symptoms worsen or if the condition fails to improve as anticipated.  I provided 25 minutes of non-face-to-face time during this encounter.  The entirety of the information documented in the History of Present Illness, Review of Systems and Physical Exam were personally obtained by me. Portions of this information were initially documented by the CMA and reviewed by me for thoroughness and accuracy.   Red Flags discussed. The patient was given clear instructions to go to ER or return to medical center if any red flags develop, symptoms do not improve, worsen or new problems develop. They verbalized understanding. I discussed the limitations of evaluation and management by telemedicine and the availability of in person appointments. The patient expressed understanding and agreed to proceed.   Jairo Ben, FNP [facility] [phone] (phone) [phone] (fax)  [facility]

## 2020-09-26 NOTE — Patient Instructions (Signed)
Benzonatate capsules What is this medicine? BENZONATATE (ben ZOE na tate) is used to treat cough. This medicine may be used for other purposes; ask your health care provider or pharmacist if you have questions. COMMON BRAND NAME(S): Tessalon Perles, Zonatuss What should I tell my health care provider before I take this medicine? They need to know if you have any of these conditions:  kidney or liver disease  an unusual or allergic reaction to benzonatate, anesthetics, other medicines, foods, dyes, or preservatives  pregnant or trying to get pregnant  breast-feeding How should I use this medicine? Take this medicine by mouth with a glass of water. Follow the directions on the prescription label. Avoid breaking, chewing, or sucking the capsule, as this can cause serious side effects. Take your medicine at regular intervals. Do not take your medicine more often than directed. Talk to your pediatrician regarding the use of this medicine in children. While this drug may be prescribed for children as young as 14 years old for selected conditions, precautions do apply. Overdosage: If you think you have taken too much of this medicine contact a poison control center or emergency room at once. NOTE: This medicine is only for you. Do not share this medicine with others. What if I miss a dose? If you miss a dose, take it as soon as you can. If it is almost time for your next dose, take only that dose. Do not take double or extra doses. What may interact with this medicine? Do not take this medicine with any of the following medications:  MAOIs like Carbex, Eldepryl, Marplan, Nardil, and Parnate This list may not describe all possible interactions. Give your health care provider a list of all the medicines, herbs, non-prescription drugs, or dietary supplements you use. Also tell them if you smoke, drink alcohol, or use illegal drugs. Some items may interact with your medicine. What should I watch for  while using this medicine? Tell your doctor if your symptoms do not improve or if they get worse. If you have a high fever, skin rash, or headache, see your health care professional. You may get drowsy or dizzy. Do not drive, use machinery, or do anything that needs mental alertness until you know how this medicine affects you. Do not sit or stand up quickly, especially if you are an older patient. This reduces the risk of dizzy or fainting spells. What side effects may I notice from receiving this medicine? Side effects that you should report to your doctor or health care professional as soon as possible:  allergic reactions like skin rash, itching or hives, swelling of the face, lips, or tongue  breathing problems  chest pain  confusion or hallucinations  irregular heartbeat  numbness of mouth or throat  seizures Side effects that usually do not require medical attention (report to your doctor or health care professional if they continue or are bothersome):  burning feeling in the eyes  constipation  headache  nasal congestion  stomach upset This list may not describe all possible side effects. Call your doctor for medical advice about side effects. You may report side effects to FDA at 1-800-FDA-1088. Where should I keep my medicine? Keep out of the reach of children. Store at room temperature between 15 and 30 degrees C (59 and 86 degrees F). Keep tightly closed. Protect from light and moisture. Throw away any unused medicine after the expiration date. NOTE: This sheet is a summary. It may not cover all possible information.  If you have questions about this medicine, talk to your doctor, pharmacist, or health care provider.  2021 Elsevier/Gold Standard (2007-08-26 14:52:56) Cetirizine tablets What is this medicine? CETIRIZINE (se TI ra zeen) is an antihistamine. This medicine is used to treat or prevent symptoms of allergies. It is also used to help reduce itchy skin rash  and hives. This medicine may be used for other purposes; ask your health care provider or pharmacist if you have questions. COMMON BRAND NAME(S): All Day Allergy, Allergy Relief, Zyrtec, Zyrtec Hives Relief What should I tell my health care provider before I take this medicine? They need to know if you have any of these conditions:  kidney disease  liver disease  an unusual or allergic reaction to cetirizine, hydroxyzine, other medicines, foods, dyes, or preservatives  pregnant or trying to get pregnant  breast-feeding How should I use this medicine? Take this medicine by mouth with a glass of water. Follow the directions on the prescription label. You can take this medicine with food or on an empty stomach. Take your medicine at regular times. Do not take more often than directed. You may need to take this medicine for several days before your symptoms improve. Talk to your pediatrician regarding the use of this medicine in children. Special care may be needed. While this drug may be prescribed for children as young as 27 years of age for selected conditions, precautions do apply. Overdosage: If you think you have taken too much of this medicine contact a poison control center or emergency room at once. NOTE: This medicine is only for you. Do not share this medicine with others. What if I miss a dose? If you miss a dose, take it as soon as you can. If it is almost time for your next dose, take only that dose. Do not take double or extra doses. What may interact with this medicine?  alcohol  certain medicines for anxiety or sleep  narcotic medicines for pain  other medicines for colds or allergies This list may not describe all possible interactions. Give your health care provider a list of all the medicines, herbs, non-prescription drugs, or dietary supplements you use. Also tell them if you smoke, drink alcohol, or use illegal drugs. Some items may interact with your medicine. What  should I watch for while using this medicine? Visit your doctor or health care professional for regular checks on your health. Tell your doctor if your symptoms do not improve. You may get drowsy or dizzy. Do not drive, use machinery, or do anything that needs mental alertness until you know how this medicine affects you. Do not stand or sit up quickly, especially if you are an older patient. This reduces the risk of dizzy or fainting spells. Your mouth may get dry. Chewing sugarless gum or sucking hard candy, and drinking plenty of water may help. Contact your doctor if the problem does not go away or is severe. What side effects may I notice from receiving this medicine? Side effects that you should report to your doctor or health care professional as soon as possible:  allergic reactions like skin rash, itching or hives, swelling of the face, lips, or tongue  changes in vision or hearing  fast or irregular heartbeat  trouble passing urine or change in the amount of urine Side effects that usually do not require medical attention (report to your doctor or health care professional if they continue or are bothersome):  dizziness  dry mouth  irritability  sore throat  stomach pain  tiredness This list may not describe all possible side effects. Call your doctor for medical advice about side effects. You may report side effects to FDA at 1-800-FDA-1088. Where should I keep my medicine? Keep out of the reach of children. Store at room temperature between 15 and 30 degrees C (59 and 86 degrees F). Throw away any unused medicine after the expiration date. NOTE: This sheet is a summary. It may not cover all possible information. If you have questions about this medicine, talk to your doctor, pharmacist, or health care provider.  2021 Elsevier/Gold Standard (2014-06-21 13:44:42) Amoxicillin Capsules or Tablets What is this medicine? AMOXICILLIN (a mox i SIL in) is a penicillin  antibiotic. It is used to treat certain kinds of bacterial infections. It will not work for colds, flu, or other viral infections. This medicine may be used for other purposes; ask your health care provider or pharmacist if you have questions. COMMON BRAND NAME(S): Amoxil, Moxilin, Sumox, Trimox What should I tell my health care provider before I take this medicine? They need to know if you have any of these conditions:  kidney disease  an unusual or allergic reaction to amoxicillin, other penicillins, cephalosporin antibiotics, other medicines, foods, dyes, or preservatives  pregnant or trying to get pregnant  breast-feeding How should I use this medicine? Take this medicine by mouth with a glass of water. Follow the directions on your prescription label. You can take it with or without food. If it upsets your stomach, take it with food. Take your medicine at regular intervals. Do not take your medicine more often than directed. Take all of your medicine as directed even if you think you are better. Do not skip doses or stop your medicine early. Talk to your pediatrician regarding the use of this medicine in children. While this drug may be prescribed for selected conditions, precautions do apply. Overdosage: If you think you have taken too much of this medicine contact a poison control center or emergency room at once. NOTE: This medicine is only for you. Do not share this medicine with others. What if I miss a dose? If you miss a dose, take it as soon as you can. If it is almost time for your next dose, take only that dose. Do not take double or extra doses. What may interact with this medicine?  allopurinol  birth control pills  certain antibiotics like chloramphenicol, erythromycin, sulfamethoxazole, tetracycline  certain medicines that treat or prevent blood clots like warfarin This list may not describe all possible interactions. Give your health care provider a list of all the  medicines, herbs, non-prescription drugs, or dietary supplements you use. Also tell them if you smoke, drink alcohol, or use illegal drugs. Some items may interact with your medicine. What should I watch for while using this medicine? Tell your health care professional if your symptoms do not start to get better or if they get worse. Do not treat diarrhea with over the counter products. Contact your health care professional if you have diarrhea that lasts more than 2 days or if it is severe and watery. If you have diabetes, you may get a false-positive result for sugar in your urine. Check with your health care professional. Birth control may not work properly while you are taking this medicine. Talk to your health care professional about using an extra method of birth control. This medicine may cause serious skin reactions. They can happen weeks to months after  starting the medicine. Contact your health care provider right away if you notice fevers or flu-like symptoms with a rash. The rash may be red or purple and then turn into blisters or peeling of the skin. Or, you might notice a red rash with swelling of the face, lips or lymph nodes in your neck or under your arms. What side effects may I notice from receiving this medicine? Side effects that you should report to your doctor or health care professional as soon as possible:  allergic reactions like skin rash, itching or hives, swelling of the face, lips, or tongue  bloody or watery diarrhea  breathing problems  feeling faint; lightheaded, falls  fever  redness, blistering, peeling or loosening of the skin, including inside the mouth  seizures  signs and symptoms of kidney injury like trouble passing urine or change in the amount of urine  signs and symptoms of liver injury like dark yellow or brown urine; general ill feeling or flu-like symptoms; light-colored stools; loss of appetite; nausea; right upper belly pain; unusually weak  or tired; yellowing of the eyes or skin  unusual bleeding or bruising  unusually weak or tired Side effects that usually do not require medical attention (report to your doctor or health care professional if they continue or are bothersome):  anxious  confusion  diarrhea  dizziness  headache  nausea, vomiting  stomach upset  trouble sleeping This list may not describe all possible side effects. Call your doctor for medical advice about side effects. You may report side effects to FDA at 1-800-FDA-1088. Where should I keep my medicine? Keep out of the reach of children. Store at room temperature between 20 and 25 degrees C (68 and 77 degrees F). Throw away any unused medicine after the expiration date. NOTE: This sheet is a summary. It may not cover all possible information. If you have questions about this medicine, talk to your doctor, pharmacist, or health care provider.  2021 Elsevier/Gold Standard (2020-04-11 12:05:55) Sore Throat When you have a sore throat, your throat may feel:  Tender.  Burning.  Irritated.  Scratchy.  Painful when you swallow.  Painful when you talk. Many things can cause a sore throat, such as:  An infection.  Allergies.  Dry air.  Smoke or pollution.  Radiation treatment.  Gastroesophageal reflux disease (GERD).  A tumor. A sore throat can be the first sign of another sickness. It can happen with other problems, like:  Coughing.  Sneezing.  Fever.  Swelling in the neck. Most sore throats go away without treatment. Follow these instructions at home:  Take over-the-counter medicines only as told by your doctor. ? If your child has a sore throat, do not give your child aspirin.  Drink enough fluids to keep your pee (urine) pale yellow.  Rest when you feel you need to.  To help with pain: ? Sip warm liquids, such as broth, herbal tea, or warm water. ? Eat or drink cold or frozen liquids, such as frozen ice  pops. ? Gargle with a salt-water mixture 3-4 times a day or as needed. To make a salt-water mixture, add -1 tsp (3-6 g) of salt to 1 cup (237 mL) of warm water. Mix it until you cannot see the salt anymore. ? Suck on hard candy or throat lozenges. ? Put a cool-mist humidifier in your bedroom at night. ? Sit in the bathroom with the door closed for 5-10 minutes while you run hot water in the shower.  Do not use any products that contain nicotine or tobacco, such as cigarettes, e-cigarettes, and chewing tobacco. If you need help quitting, ask your doctor.  Wash your hands well and often with soap and water. If soap and water are not available, use hand sanitizer.      Contact a doctor if:  You have a fever for more than 2-3 days.  You keep having symptoms for more than 2-3 days.  Your throat does not get better in 7 days.  You have a fever and your symptoms suddenly get worse.  Your child who is 3 months to 70 years old has a temperature of 102.28F (39C) or higher. Get help right away if:  You have trouble breathing.  You cannot swallow fluids, soft foods, or your saliva.  You have swelling in your throat or neck that gets worse.  You keep feeling sick to your stomach (nauseous).  You keep throwing up (vomiting). Summary  A sore throat is pain, burning, irritation, or scratchiness in the throat. Many things can cause a sore throat.  Take over-the-counter medicines only as told by your doctor. Do not give your child aspirin.  Drink plenty of fluids, and rest as needed.  Contact a doctor if your symptoms get worse or your sore throat does not get better within 7 days. This information is not intended to replace advice given to you by your health care provider. Make sure you discuss any questions you have with your health care provider. Document Revised: 10/27/2017 Document Reviewed: 10/27/2017 Elsevier Patient Education  2021 Elsevier Inc. Otitis Media, Adult  Otitis  media is a condition in which the middle ear is red and swollen (inflamed) and full of fluid. The middle ear is the part of the ear that contains bones for hearing as well as air that helps send sounds to the brain. The condition usually goes away on its own. What are the causes? This condition is caused by a blockage in the eustachian tube. The eustachian tube connects the middle ear to the back of the nose. It normally allows air into the middle ear. The blockage is caused by fluid or swelling. Problems that can cause blockage include:  A cold or infection that affects the nose, mouth, or throat.  Allergies.  An irritant, such as tobacco smoke.  Adenoids that have become large. The adenoids are soft tissue located in the back of the throat, behind the nose and the roof of the mouth.  Growth or swelling in the upper part of the throat, just behind the nose (nasopharynx).  Damage to the ear caused by change in pressure. This is called barotrauma. What are the signs or symptoms? Symptoms of this condition include:  Ear pain.  Fever.  Problems with hearing.  Being tired.  Fluid leaking from the ear.  Ringing in the ear. How is this treated? This condition can go away on its own within 3-5 days. But if the condition is caused by bacteria or does not go away on its own, or if it keeps coming back, your doctor may:  Give you antibiotic medicines.  Give you medicines for pain. Follow these instructions at home:  Take over-the-counter and prescription medicines only as told by your doctor.  If you were prescribed an antibiotic medicine, take it as told by your doctor. Do not stop taking the antibiotic even if you start to feel better.  Keep all follow-up visits as told by your doctor. This is important. Contact a  doctor if:  You have bleeding from your nose.  There is a lump on your neck.  You are not feeling better in 5 days.  You feel worse instead of better. Get help  right away if:  You have pain that is not helped with medicine.  You have swelling, redness, or pain around your ear.  You get a stiff neck.  You cannot move part of your face (paralysis).  You notice that the bone behind your ear hurts when you touch it.  You get a very bad headache. Summary  Otitis media means that the middle ear is red, swollen, and full of fluid.  This condition usually goes away on its own.  If the problem does not go away, treatment may be needed. You may be given medicines to treat the infection or to treat your pain.  If you were prescribed an antibiotic medicine, take it as told by your doctor. Do not stop taking the antibiotic even if you start to feel better.  Keep all follow-up visits as told by your doctor. This is important. This information is not intended to replace advice given to you by your health care provider. Make sure you discuss any questions you have with your health care provider. Document Revised: 04/29/2019 Document Reviewed: 04/29/2019 Elsevier Patient Education  2021 [employer].

## 2020-10-05 ENCOUNTER — Telehealth: Payer: Self-pay

## 2020-10-05 NOTE — Telephone Encounter (Signed)
-----   Message from Jarvis Newcomer, RN sent at 10/03/2020 11:37 AM EDT -----  ----- Message ----- From: Kendrick Fries, CMA Sent: 10/03/2020   9:48 AM EDT To: [facility] Triage  Pt has f/u 5/2 w/[facility]. Pt f/u from echo pt cancelled. Please advise if ok for pt to keep appointment or reschedule due to incomplete testing.  Thank you, Jearld Adjutant

## 2020-10-05 NOTE — Telephone Encounter (Signed)
Tried to call patient to inform her that I am cancelling her appointment as her Echo was cancelled and not rescheduled yet. Patient had no VM available, will send her a MyChart as well requesting her to call and reschedule Echo and follow up accordingly.

## 2020-10-09 ENCOUNTER — Ambulatory Visit: Payer: Medicare Other | Admitting: Cardiology

## 2020-10-27 ENCOUNTER — Ambulatory Visit: Payer: Medicare Other | Admitting: Family Medicine

## 2020-11-13 ENCOUNTER — Ambulatory Visit: Payer: Medicare Other | Admitting: Family Medicine

## 2020-11-14 ENCOUNTER — Other Ambulatory Visit: Payer: Self-pay

## 2020-11-14 ENCOUNTER — Encounter: Payer: Self-pay | Admitting: Family Medicine

## 2020-11-14 ENCOUNTER — Ambulatory Visit (INDEPENDENT_AMBULATORY_CARE_PROVIDER_SITE_OTHER): Payer: Medicare Other | Admitting: Family Medicine

## 2020-11-14 VITALS — BP 98/63 | Temp 98.1°F | Resp 16 | Wt 209.8 lb

## 2020-11-14 DIAGNOSIS — R739 Hyperglycemia, unspecified: Secondary | ICD-10-CM | POA: Diagnosis not present

## 2020-11-14 DIAGNOSIS — M791 Myalgia, unspecified site: Secondary | ICD-10-CM

## 2020-11-14 DIAGNOSIS — M2550 Pain in unspecified joint: Secondary | ICD-10-CM | POA: Diagnosis not present

## 2020-11-14 LAB — POCT GLYCOSYLATED HEMOGLOBIN (HGB A1C)
Est. average glucose Bld gHb Est-mCnc: 103
Hemoglobin A1C: 5.2 % (ref 4.0–5.6)

## 2020-11-14 NOTE — Patient Instructions (Addendum)
.   Marvell Fuller, FNP is now seeing patients at [facility] at [address] in [place],  [phone]   . Merita Norton, NP will be joining [facility] in July 2022. You can call [facility] at [phone] in July to schedule a follow up appointment with Ms. Suzie Portela

## 2020-11-14 NOTE — Progress Notes (Signed)
Established patient visit   Patient: Brittney Kline   DOB: December 16, 2000   20 y.o. Female  MRN: [id number] Visit Date: 11/14/2020  Today's healthcare provider: Mila Merry, MD   Chief Complaint  Patient presents with  . Hyperglycemia   Subjective    HPI  Hyperglycemia, Follow-up  Lab Results  Component Value Date   HGBA1C 5.2 11/14/2020   GLUCOSE 92 08/23/2020   GLUCOSE 122 (H) 08/02/2020   GLUCOSE 102 (H) 05/07/2020    Last seen for for this on 08/23/2020 (seen by Joycelyn Man, PA-C) Management since that visit includes recommending dietary changes and exercise. Current symptoms include none and have been stable.  Prior visit with dietician: no Current diet: in general, an "unhealthy" diet Current exercise: walking  Pertinent Labs:    Component Value Date/Time   CHOL 184 08/23/2020 1557   TRIG 82 08/23/2020 1557   CREATININE 0.82 08/23/2020 1557    Wt Readings from Last 3 Encounters:  11/14/20 209 lb 12.8 oz (95.2 kg)  09/18/20 209 lb (94.8 kg)  08/23/20 208 lb (94.3 kg)    ----------------------------------------------------------------------------------------- She also complains of fatigue, muscle aches, and joint aches and is concerned she may have an autoimmune condition.     Medications: Outpatient Medications Prior to Visit  Medication Sig  . cetirizine (ZYRTEC ALLERGY) 10 MG tablet Take 1 tablet (10 mg total) by mouth daily. (Patient not taking: Reported on 11/14/2020)  . [DISCONTINUED] amoxicillin-clavulanate (AUGMENTIN) 875-125 MG tablet Take 1 tablet by mouth 2 (two) times daily.  . [DISCONTINUED] benzonatate (TESSALON) 100 MG capsule Take 1 capsule (100 mg total) by mouth 2 (two) times daily as needed for cough.   No facility-administered medications prior to visit.    Review of Systems  Constitutional: Negative for appetite change, chills, fatigue and fever.  Respiratory: Positive for shortness of breath. Negative for chest  tightness.   Cardiovascular: Negative for chest pain and palpitations.  Gastrointestinal: Positive for nausea. Negative for abdominal pain and vomiting.  Musculoskeletal: Positive for myalgias.  Neurological: Negative for dizziness and weakness.       Off balance       Objective    BP 98/63 (BP Location: Left Arm, Patient Position: Sitting, Cuff Size: Large)   Temp 98.1 F (36.7 C) (Temporal)   Resp 16   Wt 209 lb 12.8 oz (95.2 kg)   LMP  (Within Years)   BMI 36.01 kg/m     Physical Exam   General appearance: Obese female, cooperative and in no acute distress Head: Normocephalic, without obvious abnormality, atraumatic Respiratory: Respirations even and unlabored, normal respiratory rate Extremities: All extremities are intact.  Skin: Skin color, texture, turgor normal. No rashes seen  Psych: Appropriate mood and affect. Neurologic: Mental status: Alert, oriented to person, place, and time, thought content appropriate.   Results for orders placed or performed in visit on 11/14/20  POCT HgB A1C  Result Value Ref Range   Hemoglobin A1C 5.2 4.0 - 5.6 %   Est. average glucose Bld gHb Est-mCnc 103     Assessment & Plan     1. Hyperglycemia Normal A1c  2. Arthralgia, unspecified joint  - ANA w/Reflex if Positive - CYCLIC CITRUL PEPTIDE ANTIBODY, IGG/IGA - Rheumatoid factor - Sedimentation rate - CK; Future  3. Myalgia  - ANA w/Reflex if Positive - CYCLIC CITRUL PEPTIDE ANTIBODY, IGG/IGA - Rheumatoid factor - Sedimentation rate - CK; Future         The entirety of  the information documented in the History of Present Illness, Review of Systems and Physical Exam were personally obtained by me. Portions of this information were initially documented by the CMA and reviewed by me for thoroughness and accuracy.      Mila Merry, MD  [facility] [phone] (phone) [phone] (fax)  [facility]

## 2020-11-16 LAB — RHEUMATOID FACTOR: Rheumatoid fact SerPl-aCnc: <10 IU/mL (ref <14.0)

## 2020-11-16 LAB — ANA W/REFLEX IF POSITIVE: Anti Nuclear Antibody (ANA): NEGATIVE

## 2020-11-16 LAB — SEDIMENTATION RATE: Sed Rate: 7 mm/hr (ref 0–32)

## 2020-11-16 LAB — CYCLIC CITRUL PEPTIDE ANTIBODY, IGG/IGA: Cyclic Citrullin Peptide Ab: 7 units (ref 0–19)

## 2020-12-03 ENCOUNTER — Other Ambulatory Visit: Payer: Self-pay

## 2020-12-03 ENCOUNTER — Emergency Department
Admission: EM | Admit: 2020-12-03 | Discharge: 2020-12-03 | Disposition: A | Discharge status: Home / Self Care | Payer: Medicare Other | Attending: Emergency Medicine | Admitting: Emergency Medicine

## 2020-12-03 ENCOUNTER — Encounter: Payer: Self-pay | Admitting: Emergency Medicine

## 2020-12-03 ENCOUNTER — Emergency Department: Payer: Medicare Other

## 2020-12-03 DIAGNOSIS — J4520 Mild intermittent asthma, uncomplicated: Secondary | ICD-10-CM | POA: Diagnosis not present

## 2020-12-03 DIAGNOSIS — R0789 Other chest pain: Secondary | ICD-10-CM | POA: Insufficient documentation

## 2020-12-03 DIAGNOSIS — R0602 Shortness of breath: Secondary | ICD-10-CM | POA: Insufficient documentation

## 2020-12-03 DIAGNOSIS — Z87891 Personal history of nicotine dependence: Secondary | ICD-10-CM | POA: Diagnosis not present

## 2020-12-03 DIAGNOSIS — M79622 Pain in left upper arm: Secondary | ICD-10-CM | POA: Diagnosis not present

## 2020-12-03 DIAGNOSIS — R202 Paresthesia of skin: Secondary | ICD-10-CM | POA: Insufficient documentation

## 2020-12-03 DIAGNOSIS — R079 Chest pain, unspecified: Secondary | ICD-10-CM

## 2020-12-03 DIAGNOSIS — M79621 Pain in right upper arm: Secondary | ICD-10-CM | POA: Insufficient documentation

## 2020-12-03 DIAGNOSIS — F419 Anxiety disorder, unspecified: Secondary | ICD-10-CM | POA: Diagnosis not present

## 2020-12-03 LAB — CBC WITH DIFFERENTIAL/PLATELET
Abs Immature Granulocytes: 0.01 10*3/uL (ref 0.00–0.07)
Basophils Absolute: 0 10*3/uL (ref 0.0–0.1)
Basophils Relative: 0 %
Eosinophils Absolute: 0.3 10*3/uL (ref 0.0–0.5)
Eosinophils Relative: 5 %
HCT: 38.1 % (ref 36.0–46.0)
Hemoglobin: 12 g/dL (ref 12.0–15.0)
Immature Granulocytes: 0 %
Lymphocytes Relative: 48 %
Lymphs Abs: 2.2 10*3/uL (ref 0.7–4.0)
MCH: 24 pg — ABNORMAL LOW (ref 26.0–34.0)
MCHC: 31.5 g/dL (ref 30.0–36.0)
MCV: 76.2 fL — ABNORMAL LOW (ref 80.0–100.0)
Monocytes Absolute: 0.3 10*3/uL (ref 0.1–1.0)
Monocytes Relative: 6 %
Neutro Abs: 1.9 10*3/uL (ref 1.7–7.7)
Neutrophils Relative %: 41 %
Platelets: 314 10*3/uL (ref 150–400)
RBC: 5 MIL/uL (ref 3.87–5.11)
RDW: 14.1 % (ref 11.5–15.5)
WBC: 4.7 10*3/uL (ref 4.0–10.5)
nRBC: 0 % (ref 0.0–0.2)

## 2020-12-03 LAB — COMPREHENSIVE METABOLIC PANEL
ALT: 23 U/L (ref 0–44)
AST: 19 U/L (ref 15–41)
Albumin: 4 g/dL (ref 3.5–5.0)
Alkaline Phosphatase: 80 U/L (ref 38–126)
Anion gap: 4 — ABNORMAL LOW (ref 5–15)
BUN: 8 mg/dL (ref 6–20)
CO2: 25 mmol/L (ref 22–32)
Calcium: 9.6 mg/dL (ref 8.9–10.3)
Chloride: 108 mmol/L (ref 98–111)
Creatinine, Ser: 0.67 mg/dL (ref 0.44–1.00)
GFR, Estimated: >60 mL/min (ref >60)
Glucose, Bld: 101 mg/dL — ABNORMAL HIGH (ref 70–99)
Potassium: 3.7 mmol/L (ref 3.5–5.1)
Sodium: 137 mmol/L (ref 135–145)
Total Bilirubin: 0.9 mg/dL (ref 0.3–1.2)
Total Protein: 7.1 g/dL (ref 6.5–8.1)

## 2020-12-03 LAB — TROPONIN I (HIGH SENSITIVITY): Troponin I (High Sensitivity): <2 ng/L (ref <18)

## 2020-12-03 NOTE — ED Triage Notes (Signed)
Patient in via POV, reports chest "heaviness" and shortness of breath x a couple of weeks.  States, "I did vape, but it scared me so I threw it away."  Also reports sporadic muscle spasms to bilateral upper extremities.    Ambulatory to triage, vitals WDL, NAD noted at this time.

## 2020-12-03 NOTE — ED Notes (Signed)
Patient transported to x-ray. ?

## 2020-12-03 NOTE — ED Provider Notes (Signed)
[facility] Emergency Department Provider Note  ____________________________________________  Time seen: Approximately 12:07 PM  I have reviewed the triage vital signs and the nursing notes.   HISTORY  Chief Complaint Chest Pain    HPI Brittney Kline is a 20 y.o. female who presents to the emergency department complaining of some chest pain, shortness of breath, arm tingling bilaterally.  Patient states that she had initially thought her symptoms were attributed to vaping.  She had been vaping and had some of the symptoms.  She had stopped for a little, the symptoms had seemed to improve and then she restarted vaping 2 days ago and had a return of symptoms.  Patient states that it is a tightness in her chest versus true substernal pain.  No frank shortness of breath but she states that she feels like she has to "breathe deeply" to get a full breath.  Patient denies any fevers or chills, nasal congestion, sore throat.  No recent sick contacts.  She also has some tightness and cramping sensation in the bilateral upper arms that are also associated with this chest tightness.  No GI complaints.  No urinary complaints.  Patient has a history of anxiety, bipolar disorder, GERD.  No cardiac history.       Past Medical History:  Diagnosis Date   Allergy    Anemia    Anxiety    Asthma    Bipolar 1 disorder (HCC)    Depression    GERD (gastroesophageal reflux disease)    Obesity     Patient Active Problem List   Diagnosis Date Noted   Acute otitis media 09/26/2020   Head ache 08/01/2020   Neck pain 08/01/2020   Gastroesophageal reflux disease 08/01/2020   Shrimp allergy 10/19/2019   Nexplanon in place 07/16/2019   Anxiety and depression 04/21/2019   Stomach acid 10/15/2018   GBS (group B Streptococcus carrier), +RV culture, currently pregnant 09/19/2018   Anxiety about dying 05/30/2018   Mild intermittent asthma 03/18/2018   Severe episode of recurrent major  depressive disorder, 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 Authorizing Provider  cetirizine (ZYRTEC ALLERGY) 10 MG tablet Take 1 tablet (10 mg total) by mouth daily. Patient not taking: Reported on 11/14/2020 09/26/20   Flinchum, Eula Fried, FNP    Allergies Shellfish allergy  Family History  Problem Relation Age of Onset   Arthritis Mother    Bipolar disorder Mother    Bipolar disorder Father    Diabetes Father    Cerebral palsy Son    Cancer Maternal Grandfather    Anxiety disorder Maternal Grandfather     Social History Social History   Tobacco Use   Smoking status: Former    Pack years: 0.00    Types: Cigarettes   Smokeless tobacco: Never  Vaping Use   Vaping Use: Some days  Substance Use Topics   Alcohol use: Yes    Alcohol/week: 1.0 standard drink    Types: 1 Glasses of wine per week   Drug use: Not Currently    Types: Marijuana     Review of Systems  Constitutional: No fever/chills Eyes: No visual changes. No discharge ENT: No upper respiratory complaints. Cardiovascular: no  substernal chest pain.  Positive for "chest tightness." Respiratory: no cough. No SOB, but has a sensation that she needs to "take in a deep breath". Gastrointestinal: No abdominal pain.  No nausea, no vomiting.  No diarrhea.  No constipation. Genitourinary: Negative for dysuria. No hematuria Musculoskeletal: Negative for musculoskeletal pain. Skin: Negative for rash, abrasions, lacerations, ecchymosis. Neurological: Negative for headaches, focal weakness or numbness. Psychological: Positive for anxiety  10 System ROS otherwise negative.  ____________________________________________   PHYSICAL EXAM:  VITAL  SIGNS: ED Triage Vitals  Enc Vitals Group     BP 12/03/20 1142 130/67     Pulse Rate 12/03/20 1142 (!) 56     Resp 12/03/20 1142 15     Temp 12/03/20 1142 97.9 F (36.6 C)     Temp Source 12/03/20 1142 Oral     SpO2 12/03/20 1142 100 %     Weight 12/03/20 1143 209 lb (94.8 kg)     Height 12/03/20 1143 5\' 4"  (1.626 m)     Head Circumference --      Peak Flow --      Pain Score 12/03/20 1143 0     Pain Loc --      Pain Edu? --      Excl. in GC? --      Constitutional: Alert and oriented. Well appearing and in no acute distress. Eyes: Conjunctivae are normal. PERRL. EOMI. Head: Atraumatic. ENT:      Ears:       Nose: No congestion/rhinnorhea.      Mouth/Throat: Mucous membranes are moist.  Neck: No stridor.  Neck is supple full range of motion Hematological/Lymphatic/Immunilogical: No cervical lymphadenopathy. Cardiovascular: Normal rate, regular rhythm. Normal S1 and S2.  Good peripheral circulation. Respiratory: Normal respiratory effort without tachypnea or retractions. Lungs CTAB. Good air entry to the bases with no decreased or absent breath sounds. Gastrointestinal: Bowel sounds 4 quadrants. Soft and nontender to palpation. No guarding or rigidity. No palpable masses. No distention. No CVA tenderness. Musculoskeletal: Full range of motion to all extremities. No gross deformities appreciated. Neurologic:  Normal speech and language. No gross focal neurologic deficits are appreciated.  Skin:  Skin is warm, dry and intact. No rash noted. Psychiatric: Mood and affect are normal.  Patient does appear slightly anxious.  Speech and behavior are normal. Patient exhibits appropriate insight and judgement.   ____________________________________________   LABS (all labs ordered are listed, but only abnormal results are displayed)  Labs Reviewed  COMPREHENSIVE METABOLIC PANEL - Abnormal; Notable for the following components:      Result Value   Glucose, Bld 101 (*)    Anion  gap 4 (*)    All other components within normal limits  CBC WITH DIFFERENTIAL/PLATELET - Abnormal; Notable for the following components:   MCV 76.2 (*)    MCH 24.0 (*)    All other components within normal limits  TROPONIN I (HIGH SENSITIVITY)  TROPONIN I (HIGH SENSITIVITY)   ____________________________________________  EKG   ____________________________________________  RADIOLOGY I personally viewed and evaluated these images as part of my medical decision making, as well as reviewing the written report by the radiologist.  ED Provider Interpretation: No acute cardiopulmonary findings on chest x-ray  DG Chest 2 View  Result Date: 12/03/2020 CLINICAL DATA:  20 year old female with a history of chest tightness EXAM: CHEST - 2 VIEW COMPARISON:  03/20/2020 FINDINGS: Cardiomediastinal silhouette unchanged in size and contour. No evidence of central vascular congestion. No interlobular septal thickening. No pneumothorax  or pleural effusion. No confluent airspace disease. No acute displaced fracture IMPRESSION: Negative for acute cardiopulmonary disease Electronically Signed   By: Gilmer Mor D.O.   On: 12/03/2020 12:40    ____________________________________________    PROCEDURES  Procedure(s) performed:    Procedures    Medications - No data to display        Pulmonary Embolism Rule-out Criteria (PERC rule)                        If YES to ANY of the following, the PERC rule is not satisfied and cannot be used to rule out PE in this patient (consider d-dimer or imaging depending on pre-test probability).                      If NO to ALL of the following, AND the clinician's pre-test probability is <15%, the [facility] rule is satisfied and there is no need for further workup (including no need to obtain a d-dimer) as the post-test probability of pulmonary embolism is <2%.                      Mnemonic is HAD CLOTS   H - hormone use (exogenous estrogen)      No. A - age >  50                                                 No. D - DVT/PE history                                      No.   C - coughing blood (hemoptysis)                 No. L - leg swelling, unilateral                             No. O - O2 Sat on Room Air < 95%                  No. T - tachycardia (HR ? 100)                         No. S - surgery or trauma, recent                      No.   Based on my evaluation of the patient, including application of this decision instrument, further testing to evaluate for pulmonary embolism is not indicated at this time. I have discussed this recommendation with the patient who states understanding and agreement with this plan.   ____________________________________________   INITIAL IMPRESSION / ASSESSMENT AND PLAN / ED COURSE  Pertinent labs & imaging results that were available during my care of the patient were reviewed by me and considered in my medical decision making (see chart for details).  Review of the [place] CSRS was performed in accordance of the [facility] prior to dispensing any controlled drugs.           Patient's diagnosis is consistent with nonspecific chest pain, anxiety.  Patient presented to the emergency department  complaining of chest tightness, a sensation that she needed to take in a deep breath and some cramps in her upper arms.  Patient states this is been ongoing for several days.  She states that it started after she was using a vape.  Patient has discontinued the vape use but still has the symptoms.  She does have a history of anxiety supposed to be on daily medications but states that they give her nightmares and she has stopped the use of her anxiolytic.  Patient exam was reassuring.  Work-up is reassuring at this time.  At this time we have discussed the likely cause of patient's symptoms.  We discussed return precautions and patient should follow-up with her primary care. Patient is given ED precautions to return to the ED for  any worsening or new symptoms.     ____________________________________________  FINAL CLINICAL IMPRESSION(S) / ED DIAGNOSES  Final diagnoses:  Nonspecific chest pain  Anxiety      NEW MEDICATIONS STARTED DURING THIS VISIT:  ED Discharge Orders     None           This chart was dictated using voice recognition software/Dragon. Despite best efforts to proofread, errors can occur which can change the meaning. Any change was purely unintentional.    Racheal PatchesCuthriell, Revere Maahs D, PA-C 12/03/20 1410    Delton PrairieSmith, Dylan, MD 12/03/20 1510

## 2021-03-13 ENCOUNTER — Ambulatory Visit: Payer: Medicare Other | Admitting: Family Medicine

## 2021-03-13 NOTE — Progress Notes (Deleted)
      Established patient visit   Patient: Brittney Kline   DOB: 2001/06/03   20 y.o. Female  MRN: [id number] Visit Date: 03/13/2021  Today's healthcare provider: Mila Merry, MD   No chief complaint on file.  Subjective    HPI  ***  {Link to patient history deactivated due to formatting error:1}  Medications: Outpatient Medications Prior to Visit  Medication Sig   cetirizine (ZYRTEC ALLERGY) 10 MG tablet Take 1 tablet (10 mg total) by mouth daily. (Patient not taking: Reported on 11/14/2020)   No facility-administered medications prior to visit.    Review of Systems  {Labs  Heme  Chem  Endocrine  Serology  Results Review (optional):23779}   Objective    There were no vitals taken for this visit. {Show previous vital signs (optional):23777}  Physical Exam  ***  No results found for any visits on 03/13/21.  Assessment & Plan     ***  No follow-ups on file.      {provider attestation***:1}   Mila Merry, MD  [facility] [phone] (phone) [phone] (fax)  [facility]

## 2021-05-14 ENCOUNTER — Ambulatory Visit: Payer: Medicare Other | Admitting: Family Medicine

## 2021-05-29 ENCOUNTER — Encounter: Payer: Self-pay | Admitting: Physician Assistant

## 2021-05-29 ENCOUNTER — Other Ambulatory Visit (HOSPITAL_COMMUNITY)
Admission: RE | Admit: 2021-05-29 | Discharge: 2021-05-29 | Disposition: A | Discharge status: Home / Self Care | Payer: Medicare Other | Source: Ambulatory Visit | Attending: Family Medicine | Admitting: Family Medicine

## 2021-05-29 ENCOUNTER — Ambulatory Visit (INDEPENDENT_AMBULATORY_CARE_PROVIDER_SITE_OTHER): Payer: Medicare Other | Admitting: Physician Assistant

## 2021-05-29 ENCOUNTER — Other Ambulatory Visit: Payer: Self-pay

## 2021-05-29 VITALS — BP 98/65 | HR 65 | Temp 98.3°F | Resp 16 | Ht 64.0 in | Wt 196.0 lb

## 2021-05-29 DIAGNOSIS — Z113 Encounter for screening for infections with a predominantly sexual mode of transmission: Secondary | ICD-10-CM

## 2021-05-29 DIAGNOSIS — Z01419 Encounter for gynecological examination (general) (routine) without abnormal findings: Secondary | ICD-10-CM | POA: Diagnosis present

## 2021-05-29 DIAGNOSIS — Z0000 Encounter for general adult medical examination without abnormal findings: Secondary | ICD-10-CM | POA: Insufficient documentation

## 2021-05-29 DIAGNOSIS — K219 Gastro-esophageal reflux disease without esophagitis: Secondary | ICD-10-CM

## 2021-05-29 DIAGNOSIS — J4520 Mild intermittent asthma, uncomplicated: Secondary | ICD-10-CM

## 2021-05-29 DIAGNOSIS — R739 Hyperglycemia, unspecified: Secondary | ICD-10-CM | POA: Diagnosis not present

## 2021-05-29 DIAGNOSIS — Z114 Encounter for screening for human immunodeficiency virus [HIV]: Secondary | ICD-10-CM

## 2021-05-29 DIAGNOSIS — Z124 Encounter for screening for malignant neoplasm of cervix: Secondary | ICD-10-CM

## 2021-05-29 DIAGNOSIS — Z1151 Encounter for screening for human papillomavirus (HPV): Secondary | ICD-10-CM | POA: Diagnosis not present

## 2021-05-29 DIAGNOSIS — Z1159 Encounter for screening for other viral diseases: Secondary | ICD-10-CM

## 2021-05-29 NOTE — Assessment & Plan Note (Signed)
Chronic history condition without recent exacerbation and currently unmanaged Recommend follow up in 3-6 months for monitoring.

## 2021-05-29 NOTE — Patient Instructions (Signed)
Please review the information included in your AVS to help inform your decision about birth control options.  Return in one year for annual physical exam.

## 2021-05-29 NOTE — Progress Notes (Signed)
Complete physical exam   Patient: Brittney Kline   DOB: 2000-10-05   20 y.o. Female  MRN: [id number] Visit Date: 05/29/2021  Today's healthcare provider: Almon Register, PA-C   Chief Complaint  Patient presents with   Annual Exam   Subjective    Brittney Kline is a 20 y.o. female who presents today for a complete physical exam.  She reports consuming a general diet. Home exercise routine includes walking. She generally feels well. She reports sleeping well.  HPI   LMP: 12/10- 12/14  Menses: states it is heavy which is normal for her. States she does have PMS type symptoms comprised of cramping, diarrhea.  Diet: States "it's okay". Reports eating chicken, fried foods, decreased consumption of fruits and vegetables. Denies whole grain incorporation into diet  Sleep: Since she was diagnosed with bipolar, she reports intermittent sleep disturbance. States she is not feeling well-rested.   Mood: Previous diagnosed with bipolar but is not currently taking anything for management. Overall reports her mood is "okay". She is staying with her son's grandmother currently with plans to move into independent living. Reports visual "floaties"  and states this is how her previous visual hallucinations began. Did not seem interested in further management options when asked nor did she seem concerned for progression of hallucinations.    Reports she had a  Nexplanon but it was removed. She is interested in discussing birth control today.   She reports concern for "bumps" in her mouth that  are not painful in the four corners of her mouth on the cheeks. She is not sure how long they have been there - this has been reviewed by UC provider in Oct  Dentist: Patient is not currently seeing dental services. Most recent appt was over 2 years ago  Vision: Patient has not been to eye doctor in several years.      Past Medical History:  Diagnosis Date   Allergy    Anemia    Anxiety    Asthma     Bipolar 1 disorder (HCC)    Depression    GERD (gastroesophageal reflux disease)    Obesity    Past Surgical History:  Procedure Laterality Date   MANDIBLE SURGERY     VAGINAL DELIVERY     Social History   Socioeconomic History   Marital status: Single    Spouse name: Not on file   Number of children: Not on file   Years of education: Not on file   Highest education level: Not on file  Occupational History   Not on file  Tobacco Use   Smoking status: Former    Types: Cigarettes   Smokeless tobacco: Never  Vaping Use   Vaping Use: Some days  Substance and Sexual Activity   Alcohol use: Yes    Alcohol/week: 1.0 standard drink    Types: 1 Glasses of wine per week   Drug use: Not Currently    Types: Marijuana   Sexual activity: Yes  Other Topics Concern   Not on file  Social History Narrative   Not on file   Social Determinants of Health   Financial Resource Strain: Not on file  Food Insecurity: Not on file  Transportation Needs: Not on file  Physical Activity: Not on file  Stress: Not on file  Social Connections: Not on file  Intimate Partner Violence: Not on file   Family Status  Relation Name Status   Mother  (Not Specified)  Father  (Not Specified)   Son  (Not Specified)   MGM  Deceased   MGF  (Not Specified)   Family History  Problem Relation Age of Onset   Arthritis Mother    Bipolar disorder Mother    Bipolar disorder Father    Diabetes Father    Cerebral palsy Son    Cancer Maternal Grandfather    Anxiety disorder Maternal Grandfather    Allergies  Allergen Reactions   Shellfish Allergy Anaphylaxis    Patient Care Team: Mikey Kirschner, PA-C as PCP - General (Physician Assistant) Kate Sable, MD as PCP - Cardiology (Cardiology)   Medications: Outpatient Medications Prior to Visit  Medication Sig   cetirizine (ZYRTEC ALLERGY) 10 MG tablet Take 1 tablet (10 mg total) by mouth daily. (Patient not taking: Reported on 11/14/2020)    No facility-administered medications prior to visit.    Review of Systems  Constitutional:  Negative for fatigue, fever and unexpected weight change.  HENT:  Positive for mouth sores (Denies pain) and tinnitus. Negative for congestion, ear pain, sore throat and trouble swallowing (CP).   Eyes:  Positive for visual disturbance (States she is seeing floaters and bringing objects into focus is difficult).  Respiratory:  Negative for cough, shortness of breath and wheezing.   Cardiovascular:  Negative for chest pain and palpitations.  Gastrointestinal:  Negative for blood in stool, constipation, diarrhea, nausea and vomiting.  Endocrine: Negative for polydipsia, polyphagia and polyuria.  Genitourinary:  Negative for dyspareunia, dysuria, hematuria, menstrual problem, pelvic pain, vaginal discharge and vaginal pain.  Musculoskeletal:  Negative for arthralgias, myalgias, neck pain and neck stiffness.  Skin:  Negative for rash.  Neurological:  Negative for dizziness, syncope, weakness, light-headedness, numbness and headaches.  Psychiatric/Behavioral:  Positive for hallucinations (Reports visual disturbance last night congruent with previous hallucinations). Negative for dysphoric mood and suicidal ideas. The patient is not nervous/anxious.   All other systems reviewed and are negative.    Objective    BP 98/65 (BP Location: Right Arm, Patient Position: Sitting, Cuff Size: Large)    Pulse 65    Temp 98.3 F (36.8 C) (Temporal)    Resp 16    Ht _0  (1.626 m)    Wt 196 lb (88.9 kg)    LMP 05/19/2021    SpO2 99%    BMI 33.64 kg/m    Physical Exam Exam conducted with a chaperone present.  Constitutional:      Appearance: Normal appearance. She is obese.  HENT:     Head: Normocephalic and atraumatic.     Right Ear: Hearing, tympanic membrane and ear canal normal.     Left Ear: Hearing, tympanic membrane and ear canal normal.     Nose: Nose normal.     Mouth/Throat:     Mouth: Mucous  membranes are moist. Oral lesions present.     Tongue: No lesions.     Palate: No lesions.     Pharynx: Oropharynx is clear. No oropharyngeal exudate or posterior oropharyngeal erythema.     Tonsils: No tonsillar exudate.     Comments: Potential apthous ulcer noted along left cheek along with mild abrasions that could be congruent with biting cheek.  Eyes:     Extraocular Movements: Extraocular movements intact.     Conjunctiva/sclera: Conjunctivae normal.     Pupils: Pupils are equal, round, and reactive to light.  Cardiovascular:     Rate and Rhythm: Normal rate and regular rhythm.     Pulses: Normal pulses.  Heart sounds: Normal heart sounds, S1 normal and S2 normal.    No S3 or S4 sounds.  Pulmonary:     Effort: Pulmonary effort is normal. No respiratory distress.     Breath sounds: Normal breath sounds. No decreased breath sounds, wheezing, rhonchi or rales.  Abdominal:     General: Abdomen is flat. Bowel sounds are normal. There is no distension.     Palpations: Abdomen is soft.     Tenderness: There is no abdominal tenderness.  Genitourinary:    General: Normal vulva.     Pubic Area: No rash.      Labia:        Right: No rash, tenderness or lesion.        Left: No rash, tenderness or lesion.      Vagina: Vaginal discharge present. No erythema, tenderness or bleeding.     Cervix: No cervical motion tenderness, discharge, friability or erythema.     Uterus: Normal.      Comments: Difficult to fully palpate adnexa due to patient movement.  Musculoskeletal:     Cervical back: Normal range of motion and neck supple.     Right lower leg: No edema.     Left lower leg: No edema.  Skin:    General: Skin is warm and dry.     Findings: No erythema or rash.  Neurological:     General: No focal deficit present.     Mental Status: She is alert.     Sensory: Sensation is intact.     Motor: Motor function is intact.     Coordination: Coordination is intact.     Gait: Gait is  intact.  Psychiatric:        Attention and Perception: She is inattentive.        Mood and Affect: Mood and affect normal.        Speech: Speech normal.        Behavior: Behavior normal. Behavior is cooperative.        Thought Content: Thought content normal.      Last depression screening scores PHQ 2/9 Scores 05/29/2021 08/23/2020 08/01/2020  PHQ - 2 Score _0 PHQ- 9 Score _1 Last fall risk screening Fall Risk  05/29/2021  Falls in the past year? 0  Number falls in past yr: 0  Injury with Fall? 0  Risk for fall due to : No Fall Risks  Follow up Falls evaluation completed   Last Audit-C alcohol use screening Alcohol Use Disorder Test (AUDIT) 05/29/2021  1. How often do you have a drink containing alcohol? 1  2. How many drinks containing alcohol do you have on a typical day when you are drinking? 0  3. How often do you have six or more drinks on one occasion? 1  AUDIT-C Score 2  4. How often during the last year have you found that you were not able to stop drinking once you had started? -  5. How often during the last year have you failed to do what was normally expected from you because of drinking? -  6. How often during the last year have you needed a first drink in the morning to get yourself going after a heavy drinking session? -  7. How often during the last year have you had a feeling of guilt of remorse after drinking? -  8. How often during the last year have you been unable to remember what happened  the night before because you had been drinking? -  9. Have you or someone else been injured as a result of your drinking? -  10. Has a relative or friend or a doctor or another health worker been concerned about your drinking or suggested you cut down? -  Alcohol Use Disorder Identification Test Final Score (AUDIT) -  Alcohol Brief Interventions/Follow-up -   A score of 3 or more in women, and 4 or more in men indicates increased risk for alcohol abuse, EXCEPT if  all of the points are from question 1   No results found for any visits on 05/29/21.  Assessment & Plan    Routine Health Maintenance and Physical Exam  Exercise Activities and Dietary recommendations  Goals   None     Immunization History  Administered Date(s) Administered   DTaP 07/02/1999, 08/31/1999   HPV 9-valent 01/14/2014   HPV Quadrivalent 09/30/2012, 11/30/2012   Hepatitis A 09/30/2012, 01/14/2014   Hepatitis B 02/14/1999, 08/31/1999, 12/27/1999   HiB (PRP-OMP) 07/02/1999, 08/31/1999, 01/18/2000   IPV 07/02/1999, 01/18/2000, 09/17/2012   Influenza,inj,Quad PF,6+ Mos 08/05/2017, 07/15/2018, 07/22/2019   Influenza-Unspecified 08/04/2017   MMR 01/18/2000, 09/17/2012   Meningococcal B, OMV 12/26/2015, 01/26/2016   Meningococcal Mcv4o 09/17/2012, 11/08/2015   PFIZER(Purple Top)SARS-COV-2 Vaccination 09/17/2019, 03/31/2020   Pneumococcal Conjugate PCV 7 07/02/1999, 08/31/1999   Td 09/30/2012   Tdap 02/18/2011, 07/15/2018   Varicella 09/18/1999, 01/18/2000    Health Maintenance  Topic Date Due   Hepatitis C Screening  Never done   PAP-Cervical Cytology Screening  Never done   PAP SMEAR-Modifier  Never done   COVID-19 Vaccine (3 - Booster for Pfizer series) 05/26/2020   INFLUENZA VACCINE  01/08/2021   Pneumococcal Vaccine 73-51 Years old (1 - PCV) 06/07/2022 (Originally 01/15/2005)   TETANUS/TDAP  07/15/2028   HPV VACCINES  Completed   HIV Screening  Completed    Discussed health benefits of physical activity, and encouraged her to engage in regular exercise appropriate for her age and condition.   Problem List Items Addressed This Visit       Respiratory   Mild intermittent asthma    Chronic history condition without recent exacerbation and currently unmanaged Recommend follow up in 3-6 months for monitoring.          Digestive   Gastroesophageal reflux disease   Relevant Orders   Comprehensive Metabolic Panel (CMET)   CBC w/Diff/Platelet     Other    Encounter for annual physical exam    Annual physical exam with 20 yo female in no acute distress with history of bipolar, anxiety, depression, asthma.  Performed PAP smear for routine screening along with manual breast exam Labs for routine monitoring include: CMP, CBC, STI panel Patient to follow up in 1 year for annual physical      Relevant Orders   CBC w/Diff/Platelet   Other Visit Diagnoses     Annual physical exam    -  Primary   Relevant Orders   Comprehensive Metabolic Panel (CMET)   CBC w/Diff/Platelet   Hyperglycemia       Relevant Orders   Comprehensive Metabolic Panel (CMET)   CBC w/Diff/Platelet   Screening for STD (sexually transmitted disease)       Relevant Orders   HIV Antibody (routine testing w rflx)   RPR   Cytology - PAP   Screening for HIV (human immunodeficiency virus)       Relevant Orders   HIV Antibody (routine testing w rflx)  Screening for cervical cancer       Relevant Orders   Cytology - PAP   Need for hepatitis C screening test       Relevant Orders   Hepatitis C Antibody        Almon Register, PA-C  [facility] [phone] (phone) [phone] (fax)  [place]

## 2021-05-29 NOTE — Assessment & Plan Note (Addendum)
Annual physical exam with 20 yo female in no acute distress with history of bipolar, anxiety, depression, asthma.  Performed PAP smear for routine screening along with manual breast exam Labs for routine monitoring include: CMP, CBC, STI panel Patient to follow up in 1 year for annual physical

## 2021-05-30 LAB — CBC WITH DIFFERENTIAL/PLATELET
Basophils Absolute: 0 10*3/uL (ref 0.0–0.2)
Basos: 0 %
EOS (ABSOLUTE): 0.1 10*3/uL (ref 0.0–0.4)
Eos: 2 %
Hematocrit: 39.2 % (ref 34.0–46.6)
Hemoglobin: 12.3 g/dL (ref 11.1–15.9)
Immature Grans (Abs): 0 10*3/uL (ref 0.0–0.1)
Immature Granulocytes: 0 %
Lymphocytes Absolute: 2.1 10*3/uL (ref 0.7–3.1)
Lymphs: 40 %
MCH: 23.4 pg — ABNORMAL LOW (ref 26.6–33.0)
MCHC: 31.4 g/dL — ABNORMAL LOW (ref 31.5–35.7)
MCV: 75 fL — ABNORMAL LOW (ref 79–97)
Monocytes Absolute: 0.3 10*3/uL (ref 0.1–0.9)
Monocytes: 6 %
Neutrophils Absolute: 2.6 10*3/uL (ref 1.4–7.0)
Neutrophils: 52 %
Platelets: 321 10*3/uL (ref 150–450)
RBC: 5.25 x10E6/uL (ref 3.77–5.28)
RDW: 13.1 % (ref 11.7–15.4)
WBC: 5.1 10*3/uL (ref 3.4–10.8)

## 2021-05-30 LAB — COMPREHENSIVE METABOLIC PANEL
ALT: 13 IU/L (ref 0–32)
AST: 18 IU/L (ref 0–40)
Albumin/Globulin Ratio: 1.8 (ref 1.2–2.2)
Albumin: 4.6 g/dL (ref 3.9–5.0)
Alkaline Phosphatase: 110 IU/L (ref 44–121)
BUN/Creatinine Ratio: 9 (ref 9–23)
BUN: 6 mg/dL (ref 6–20)
Bilirubin Total: 0.5 mg/dL (ref 0.0–1.2)
CO2: 23 mmol/L (ref 20–29)
Calcium: 9.9 mg/dL (ref 8.7–10.2)
Chloride: 101 mmol/L (ref 96–106)
Creatinine, Ser: 0.69 mg/dL (ref 0.57–1.00)
Globulin, Total: 2.6 g/dL (ref 1.5–4.5)
Glucose: 85 mg/dL (ref 70–99)
Potassium: 3.9 mmol/L (ref 3.5–5.2)
Sodium: 139 mmol/L (ref 134–144)
Total Protein: 7.2 g/dL (ref 6.0–8.5)
eGFR: 126 mL/min/{1.73_m2} (ref >59)

## 2021-05-30 LAB — HEPATITIS C ANTIBODY: Hep C Virus Ab: <0.1 s/co ratio (ref 0.0–0.9)

## 2021-06-01 LAB — CYTOLOGY - PAP
Adequacy: ABSENT
Chlamydia: NEGATIVE
Comment: NEGATIVE
Comment: NEGATIVE
Comment: NEGATIVE
Comment: NEGATIVE
Comment: NORMAL
Diagnosis: NEGATIVE
HSV1: NEGATIVE
HSV2: NEGATIVE
High risk HPV: NEGATIVE
Neisseria Gonorrhea: NEGATIVE
Trichomonas: NEGATIVE

## 2021-06-18 NOTE — Progress Notes (Deleted)
Established patient visit   Patient: Brittney Kline   DOB: 03-29-2001   21 y.o. Female  MRN: [id number] Visit Date: 06/20/2021  Today's healthcare provider: Mikey Kirschner, PA-C   No chief complaint on file.  Subjective    HPI  Concerns about hair loss  Medications: Outpatient Medications Prior to Visit  Medication Sig   cetirizine (ZYRTEC ALLERGY) 10 MG tablet Take 1 tablet (10 mg total) by mouth daily. (Patient not taking: Reported on 11/14/2020)   No facility-administered medications prior to visit.    Review of Systems  Last CBC Lab Results  Component Value Date   WBC 5.1 05/29/2021   HGB 12.3 05/29/2021   HCT 39.2 05/29/2021   MCV 75 (L) 05/29/2021   MCH 23.4 (L) 05/29/2021   RDW 13.1 05/29/2021   PLT 321 32/05/2481   Last metabolic panel Lab Results  Component Value Date   GLUCOSE 85 05/29/2021   NA 139 05/29/2021   K 3.9 05/29/2021   CL 101 05/29/2021   CO2 23 05/29/2021   BUN 6 05/29/2021   CREATININE 0.69 05/29/2021   EGFR 126 05/29/2021   CALCIUM 9.9 05/29/2021   PROT 7.2 05/29/2021   ALBUMIN 4.6 05/29/2021   LABGLOB 2.6 05/29/2021   AGRATIO 1.8 05/29/2021   BILITOT 0.5 05/29/2021   ALKPHOS 110 05/29/2021   AST 18 05/29/2021   ALT 13 05/29/2021   ANIONGAP 4 (L) 12/03/2020   Last lipids Lab Results  Component Value Date   CHOL 184 08/23/2020   HDL 55 08/23/2020   LDLCALC 114 (H) 08/23/2020   TRIG 82 08/23/2020   Last hemoglobin A1c Lab Results  Component Value Date   HGBA1C 5.2 11/14/2020   Last thyroid functions Lab Results  Component Value Date   TSH 1.510 08/23/2020   Last vitamin D No results found for: 25OHVITD2, 25OHVITD3, VD25OH Last vitamin B12 and Folate No results found for: VITAMINB12, FOLATE     Objective    There were no vitals taken for this visit. BP Readings from Last 3 Encounters:  05/29/21 98/65  12/03/20 116/84  11/14/20 98/63   Wt Readings from Last 3 Encounters:  05/29/21 196 lb (88.9 kg)   12/03/20 209 lb (94.8 kg)  11/14/20 209 lb 12.8 oz (95.2 kg)      Physical Exam  ***  No results found for any visits on 06/20/21.  Assessment & Plan     ***  No follow-ups on file.      {provider attestation***:1}   Mikey Kirschner, PA-C  [facility] [phone] (phone) [phone] (fax)  [place]

## 2021-06-18 NOTE — Patient Instructions (Incomplete)
Preventive Care 21-23 Years Old, Female °Preventive care refers to lifestyle choices and visits with your health care provider that can promote health and wellness. Preventive care visits are also called wellness exams. °What can I expect for my preventive care visit? °Counseling °During your preventive care visit, your health care provider may ask about your: °Medical history, including: °Past medical problems. °Family medical history. °Pregnancy history. °Current health, including: °Menstrual cycle. °Method of birth control. °Emotional well-being. °Home life and relationship well-being. °Sexual activity and sexual health. °Lifestyle, including: °Alcohol, nicotine or tobacco, and drug use. °Access to firearms. °Diet, exercise, and sleep habits. °Work and work environment. °Sunscreen use. °Safety issues such as seatbelt and bike helmet use. °Physical exam °Your health care provider may check your: °Height and weight. These may be used to calculate your BMI (body mass index). BMI is a measurement that tells if you are at a healthy weight. °Waist circumference. This measures the distance around your waistline. This measurement also tells if you are at a healthy weight and may help predict your risk of certain diseases, such as type 2 diabetes and high blood pressure. °Heart rate and blood pressure. °Body temperature. °Skin for abnormal spots. °What immunizations do I need? °Vaccines are usually given at various ages, according to a schedule. Your health care provider will recommend vaccines for you based on your age, medical history, and lifestyle or other factors, such as travel or where you work. °What tests do I need? °Screening °Your health care provider may recommend screening tests for certain conditions. This may include: °Pelvic exam and Pap test. °Lipid and cholesterol levels. °Diabetes screening. This is done by checking your blood sugar (glucose) after you have not eaten for a while (fasting). °Hepatitis B  test. °Hepatitis C test. °HIV (human immunodeficiency virus) test. °STI (sexually transmitted infection) testing, if you are at risk. °BRCA-related cancer screening. This may be done if you have a family history of breast, ovarian, tubal, or peritoneal cancers. °Talk with your health care provider about your test results, treatment options, and if necessary, the need for more tests. °Follow these instructions at home: °Eating and drinking ° °Eat a healthy diet that includes fresh fruits and vegetables, whole grains, lean protein, and low-fat dairy products. °Take vitamin and mineral supplements as recommended by your health care provider. °Do not drink alcohol if: °Your health care provider tells you not to drink. °You are pregnant, may be pregnant, or are planning to become pregnant. °If you drink alcohol: °Limit how much you have to 0-1 drink a day. °Know how much alcohol is in your drink. In the [place] one drink equals one 12 oz bottle of beer (355 mL), one 5 oz glass of wine (148 mL), or one 1½ oz glass of hard liquor (44 mL). °Lifestyle °Brush your teeth every morning and night with fluoride toothpaste. Floss one time each day. °Exercise for at least 30 minutes 5 or more days each week. °Do not use any products that contain nicotine or tobacco. These products include cigarettes, chewing tobacco, and vaping devices, such as e-cigarettes. If you need help quitting, ask your health care provider. °Do not use drugs. °If you are sexually active, practice safe sex. Use a condom or other form of protection to prevent STIs. °If you do not wish to become pregnant, use a form of birth control. If you plan to become pregnant, see your health care provider for a prepregnancy visit. °Find healthy ways to manage stress, such as: °Meditation, yoga,   or listening to music. °Journaling. °Talking to a trusted person. °Spending time with friends and family. °Minimize exposure to UV radiation to reduce your risk of skin  cancer. °Safety °Always wear your seat belt while driving or riding in a vehicle. °Do not drive: °If you have been drinking alcohol. Do not ride with someone who has been drinking. °If you have been using any mind-altering substances or drugs. °While texting. °When you are tired or distracted. °Wear a helmet and other protective equipment during sports activities. °If you have firearms in your house, make sure you follow all gun safety procedures. °Seek help if you have been physically or sexually abused. °What's next? °Go to your health care provider once a year for an annual wellness visit. °Ask your health care provider how often you should have your eyes and teeth checked. °Stay up to date on all vaccines. °This information is not intended to replace advice given to you by your health care provider. Make sure you discuss any questions you have with your health care provider. °Document Revised: 11/22/2020 Document Reviewed: 11/22/2020 °Elsevier Patient Education © 2022 [employer] ° °

## 2021-06-20 ENCOUNTER — Ambulatory Visit: Payer: Medicare Other | Admitting: Physician Assistant

## 2021-06-20 DIAGNOSIS — L659 Nonscarring hair loss, unspecified: Secondary | ICD-10-CM

## 2021-06-25 ENCOUNTER — Telehealth: Payer: Self-pay

## 2021-06-25 NOTE — Telephone Encounter (Signed)
Copied from CRM [id number]. Topic: General - Inquiry >> Jun 25, 2021  3:52 PM Daphine Deutscher D wrote: Reason for CRM: Pt would like a nurse to call her back about her test results from December  CB#  [phone]

## 2021-06-27 NOTE — Telephone Encounter (Signed)
LMTCB 06/27/2021.  PEC please advise what questions she has about her labs when she calls back.   Thanks,   -Vernona Rieger

## 2021-06-27 NOTE — Telephone Encounter (Signed)
Patient called, rings and no answer, recording call could not be completed at this time. °

## 2021-06-28 NOTE — Telephone Encounter (Signed)
Patient called, rings and no answer, recording call could not be completed at this time.

## 2021-07-04 ENCOUNTER — Other Ambulatory Visit: Payer: Medicare Other

## 2021-07-25 ENCOUNTER — Other Ambulatory Visit: Payer: Medicare Other

## 2021-07-26 ENCOUNTER — Other Ambulatory Visit: Payer: Medicare Other

## 2021-08-30 ENCOUNTER — Ambulatory Visit: Payer: Medicare Other | Admitting: Family Medicine

## 2021-09-11 ENCOUNTER — Ambulatory Visit: Payer: Medicare Other

## 2021-09-12 ENCOUNTER — Encounter: Payer: Self-pay | Admitting: Cardiology

## 2021-09-12 NOTE — Progress Notes (Signed)
Unable to reach patient to schedule echo, letter sent, order cancelled

## 2021-09-12 NOTE — Progress Notes (Signed)
Unable to reach patient to schedule echocardiogram. Letter sent, order cancelled

## 2021-10-26 ENCOUNTER — Telehealth: Payer: Self-pay | Admitting: Physician Assistant

## 2021-10-26 NOTE — Telephone Encounter (Signed)
Copied from CRM [id number]. Topic: Medicare AWV >> Oct 26, 2021  9:14 AM Claudette Laws R wrote: Reason for CRM:  Tried to contact to reschedule AWVI - Neither number is working

## 2021-12-23 IMAGING — CR DG CHEST 2V
1 series · 2 of 2 positions shown · non-contrast
Comparison: 05/25/2019

CLINICAL DATA: Chest pain, left breast pain, bilateral upper arm
pain

EXAM:
CHEST - 2 VIEW

[Series 1: dg chest 2 view · 0.14mm/px · 2 of 2 slices shown]
[im 1/2]
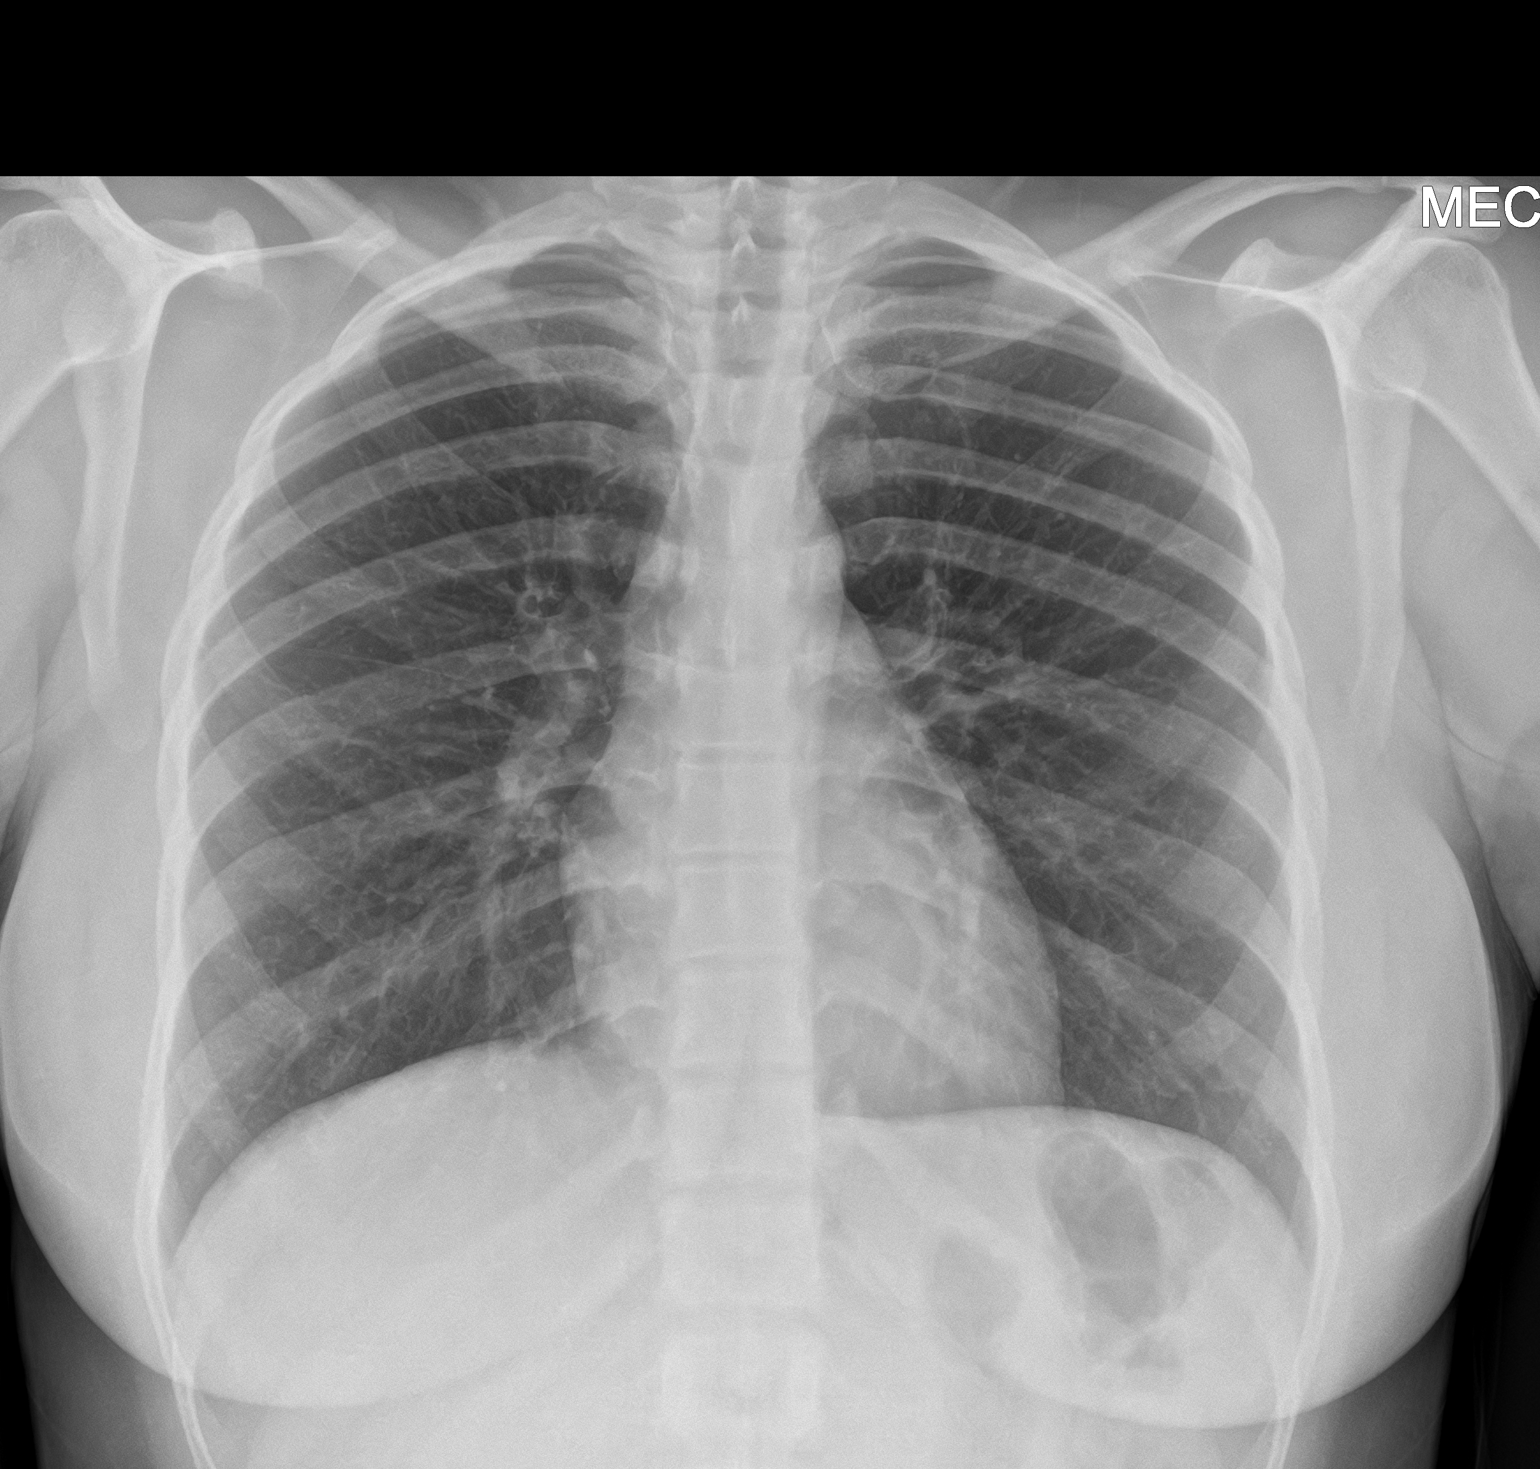
[im 2/2]
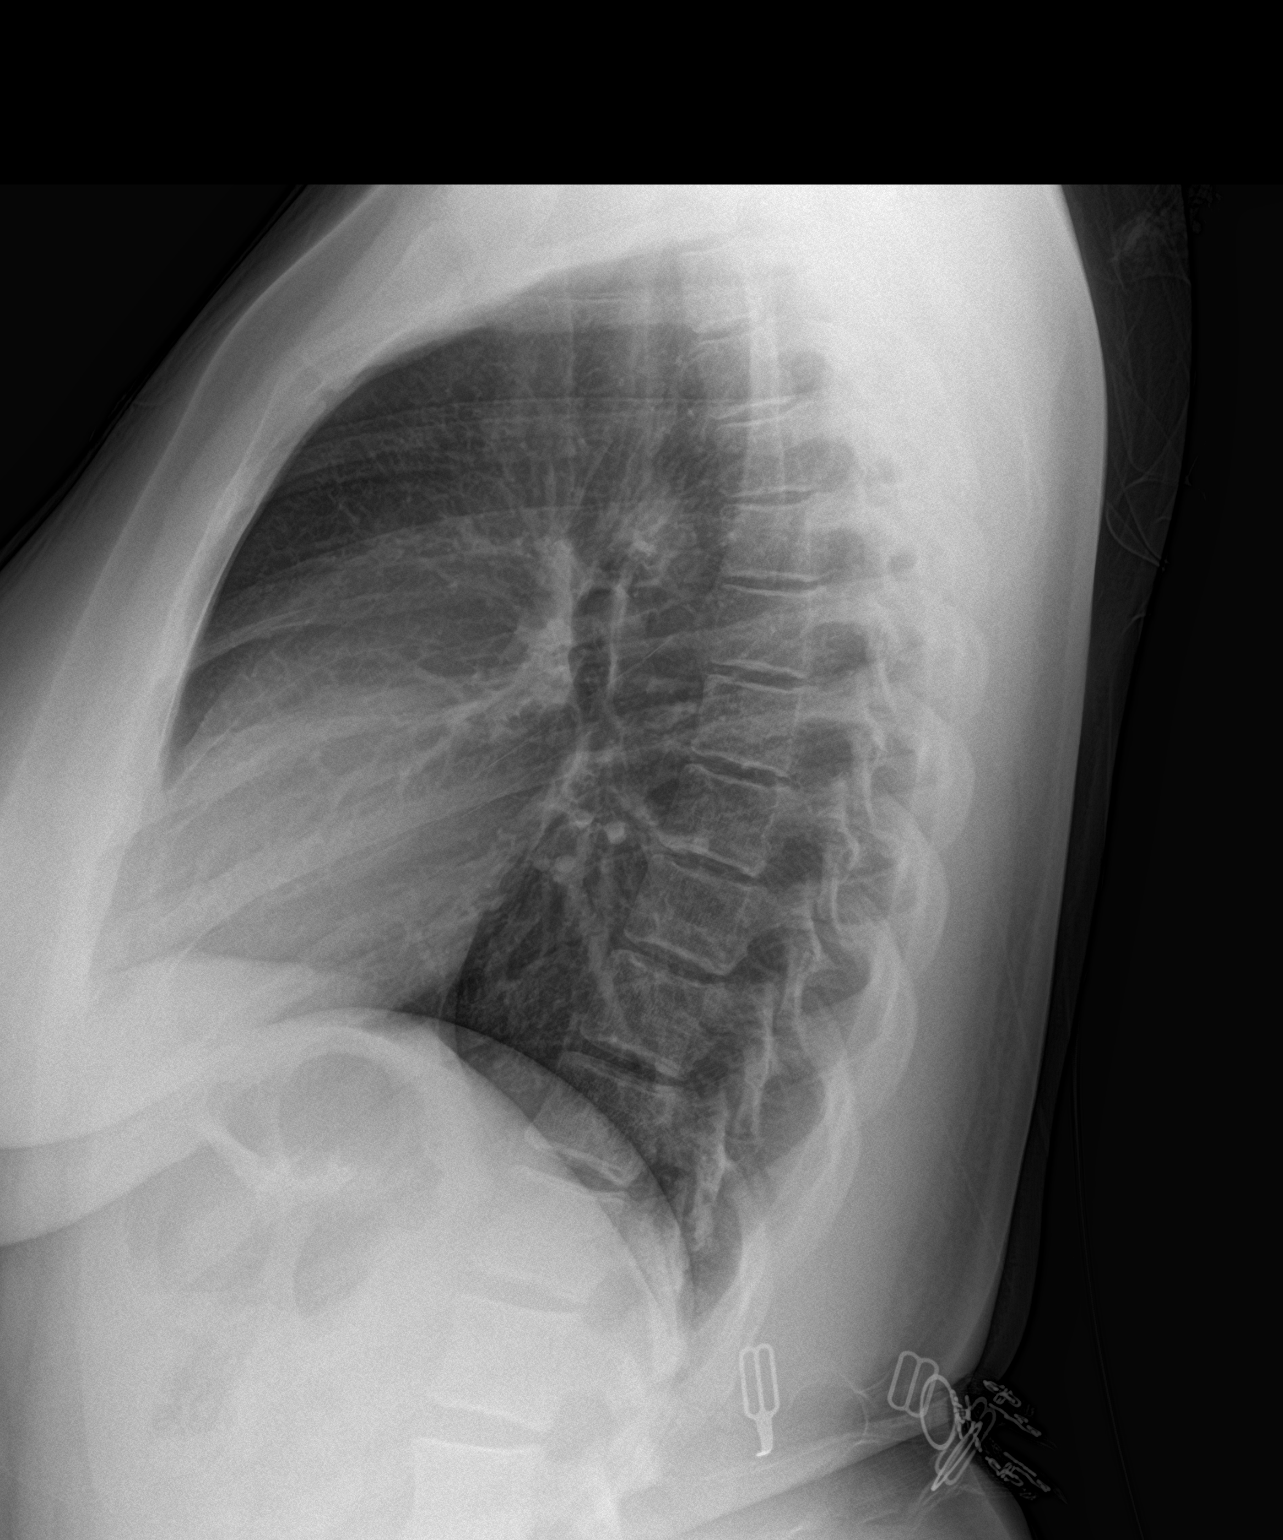

[2 of 2 positions shown; findings below may reference images not displayed]

FINDINGS: The heart size and mediastinal contours are within normal limits.
Both lungs are clear. The visualized skeletal structures are
unremarkable.
IMPRESSION: No active cardiopulmonary disease.

## 2022-01-11 IMAGING — CR DG CHEST 2V
1 series · 2 of 2 positions shown · non-contrast
Comparison: Radiograph 03/01/2020

CLINICAL DATA: Chest pain, shortness of breath

EXAM:
CHEST - 2 VIEW

[Series 1: dg chest 2 view · 0.14mm/px · 2 of 2 slices shown]
[im 1/2]
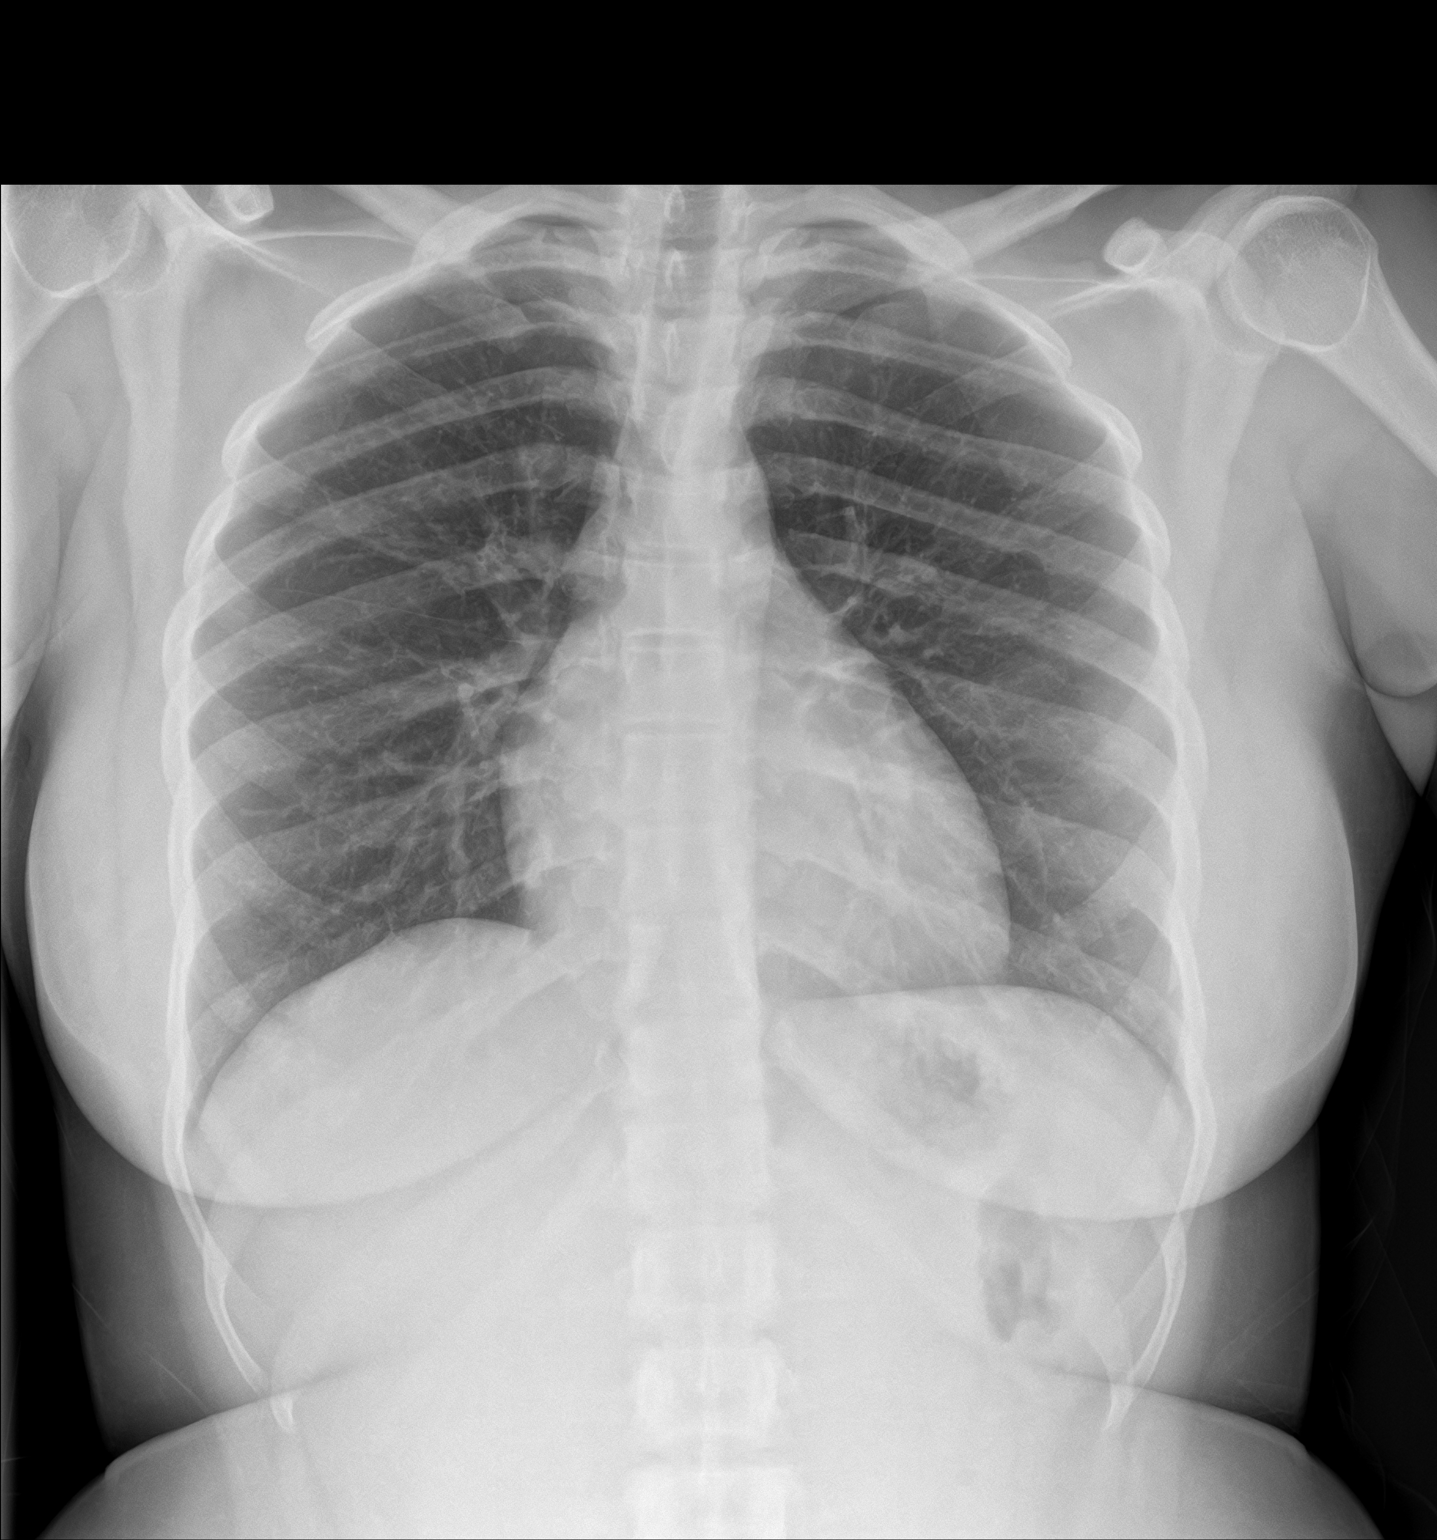
[im 2/2]
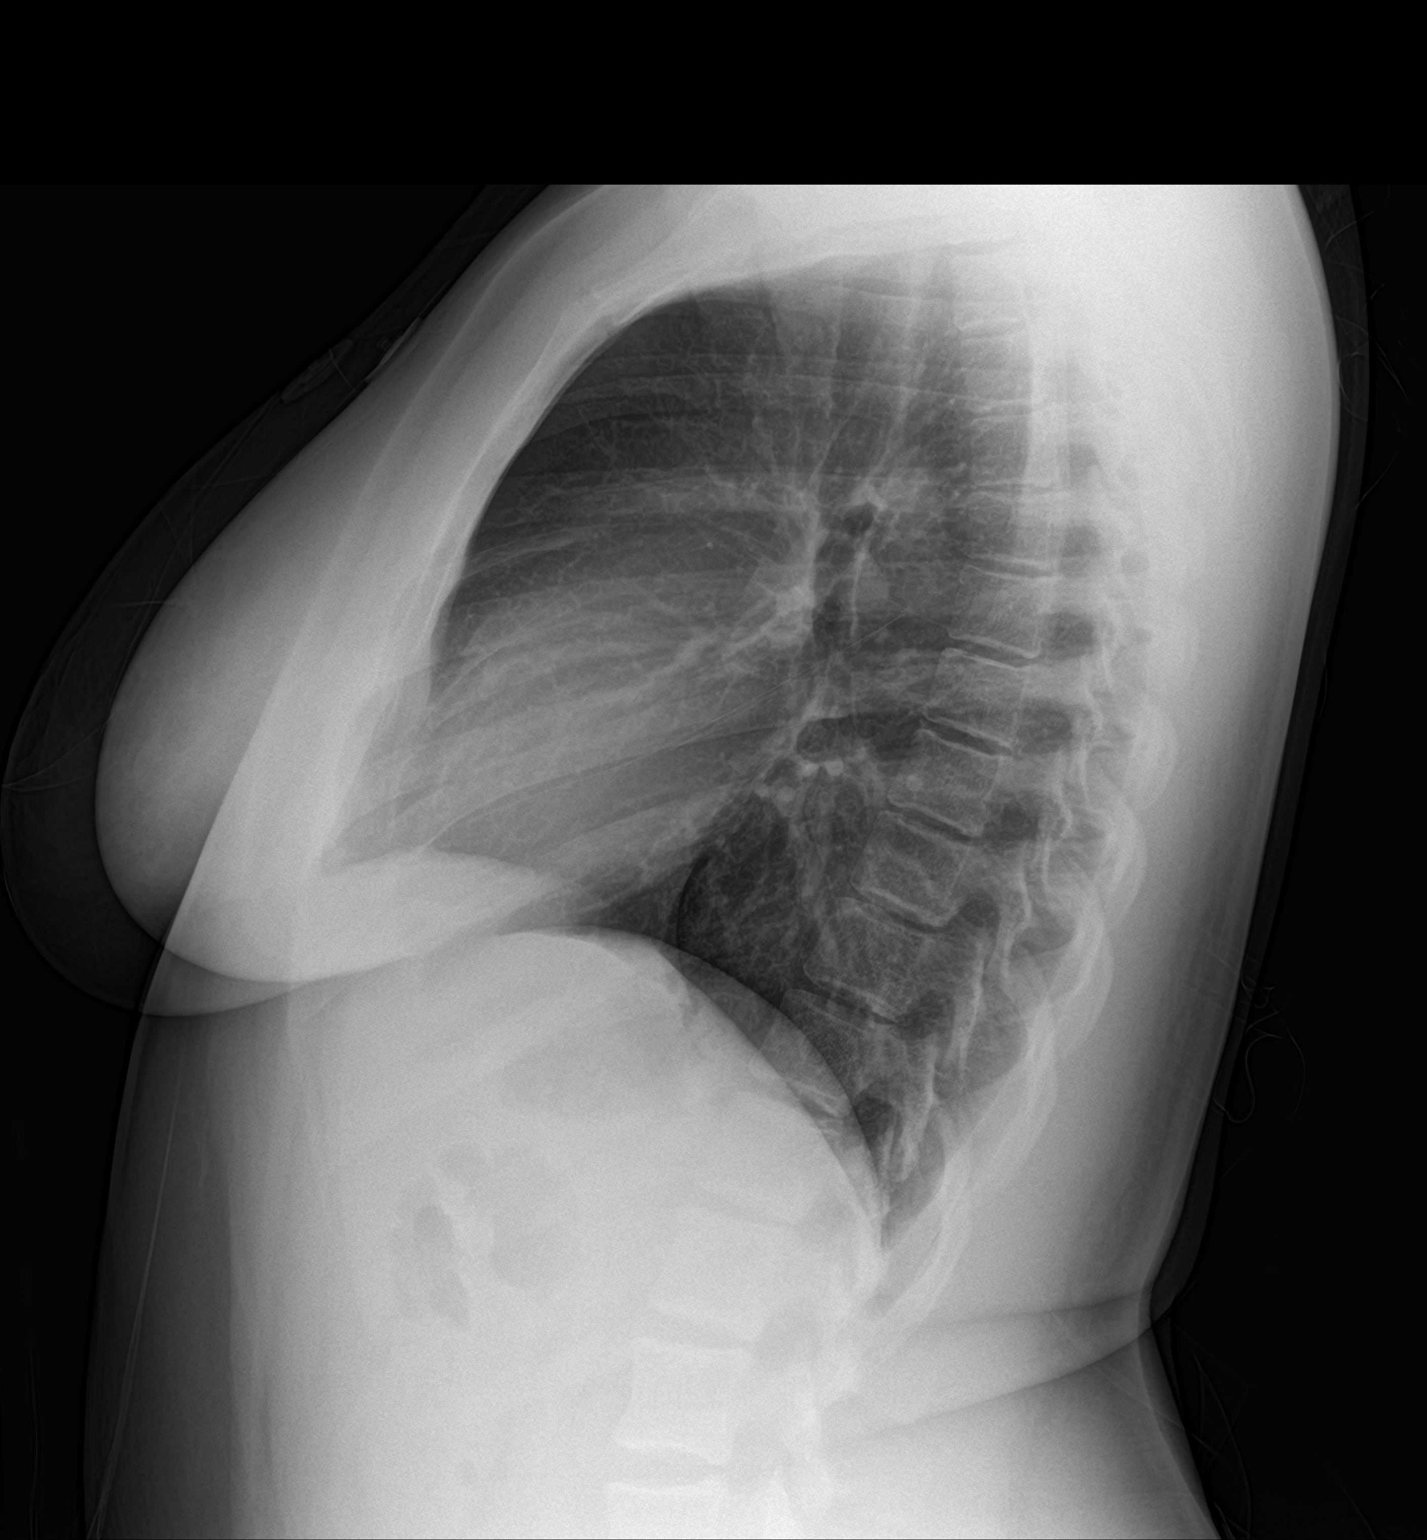

[2 of 2 positions shown; findings below may reference images not displayed]

FINDINGS: No consolidation, features of edema, pneumothorax, or effusion.
Pulmonary vascularity is normally distributed. The cardiomediastinal
contours are unremarkable. No acute osseous or soft tissue
abnormality.
IMPRESSION: No acute cardiopulmonary abnormality.

## 2022-04-08 IMAGING — CT CT ANGIO HEAD
3 of 10 series · 17 of 47 positions shown · IV contrast (omnipaque)
Comparison: None.

CLINICAL DATA: Headache. Older sister passed away secondary to
brain aneurysm in her 20s.

EXAM:
CT ANGIOGRAPHY HEAD
TECHNIQUE: Multidetector CT imaging of the head was performed using the
standard protocol during bolus administration of intravenous
contrast. Multiplanar CT image reconstructions and MIPs were
obtained to evaluate the vascular anatomy.
CONTRAST:  75mL OMNIPAQUE IOHEXOL 350 MG/ML SOLN

[Series 7: coronal soft tissue · coronal · 0.28mm/px · 1 of 64 slices shown]
[im 32/64  brain]
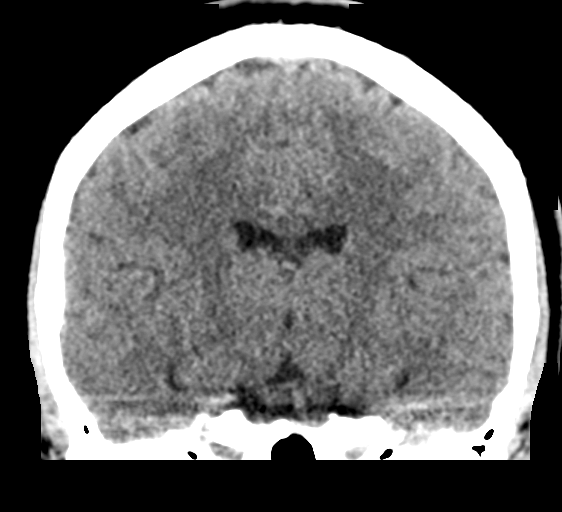

[Series 11: ax thin · axial · 0.34mm/px · z∈[+53,+176]mm · 13 of 144 slices shown]
[im 10/144  brain]
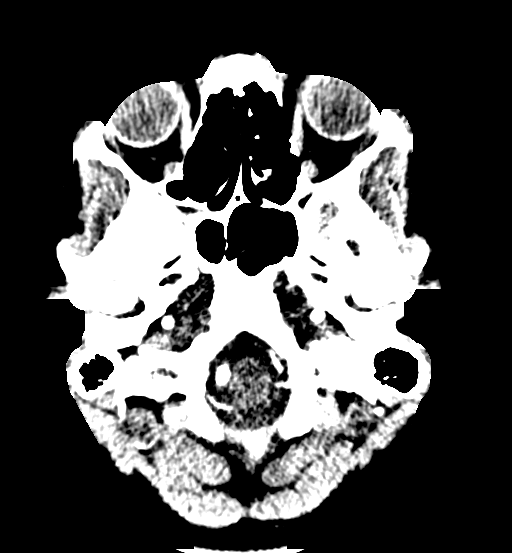
[im 20/144  bone]
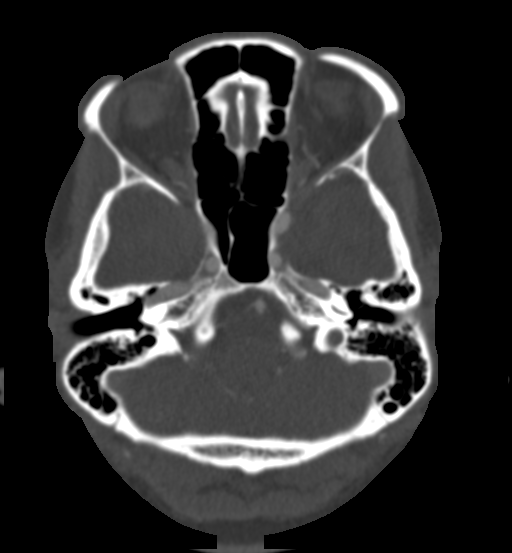
[im 29/144  brain]
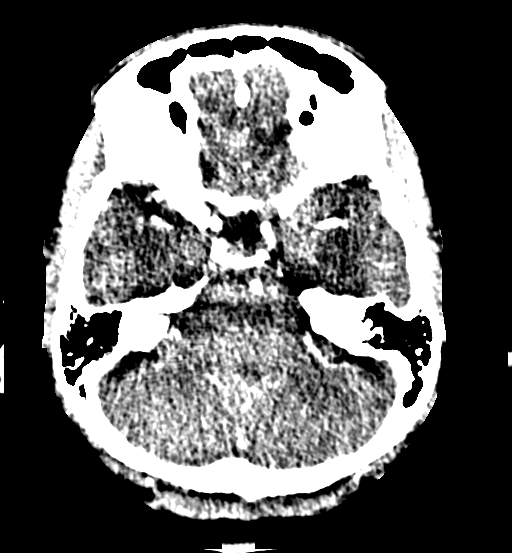
[im 39/144  bone]
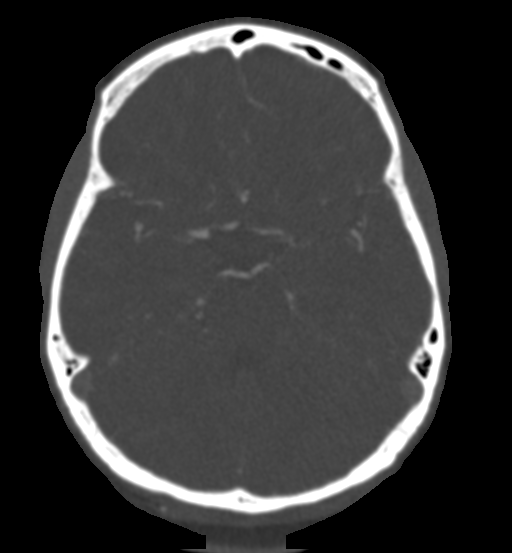
[im 48/144  brain]
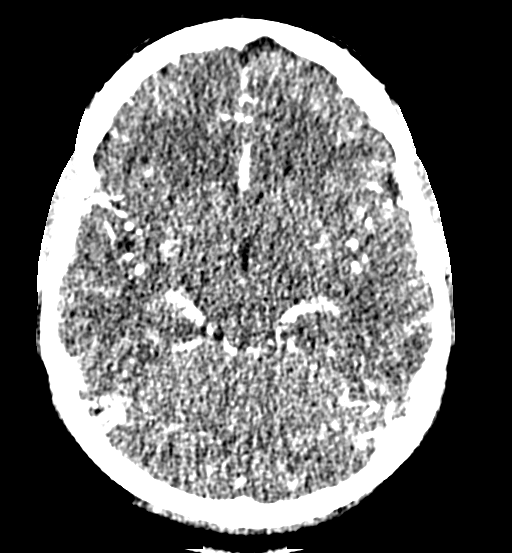
[im 58/144  bone]
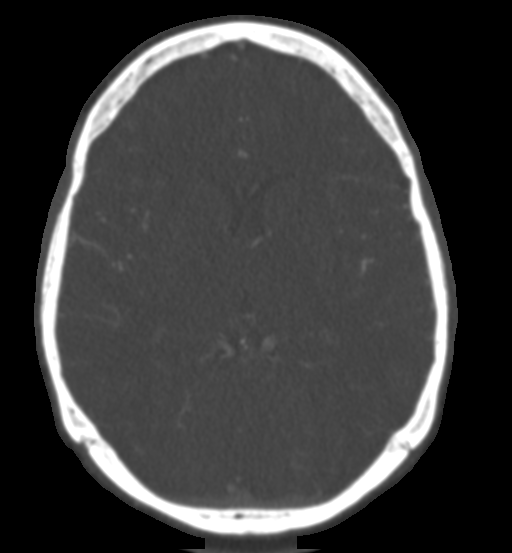
[im 77/144  brain]
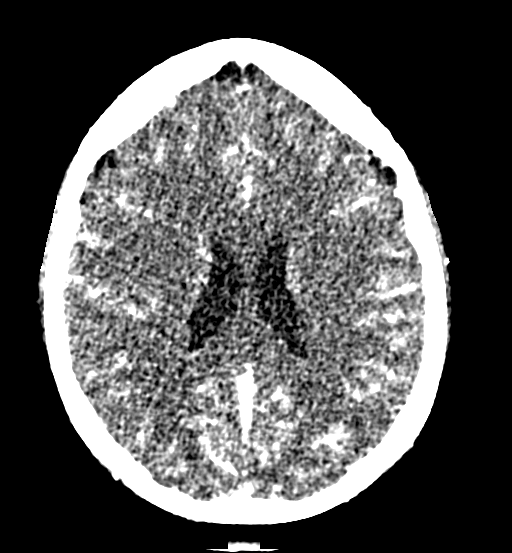
[im 86/144  bone]
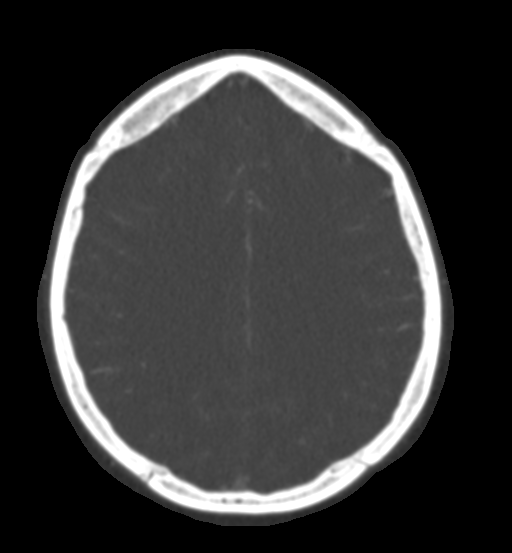
[im 96/144  brain]
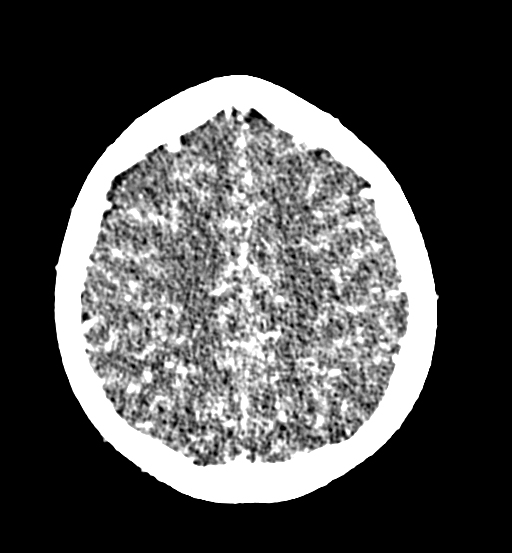
[im 105/144  bone]
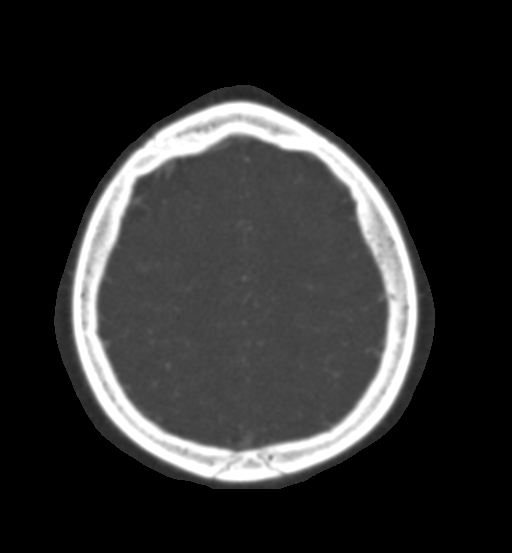
[im 115/144  brain]
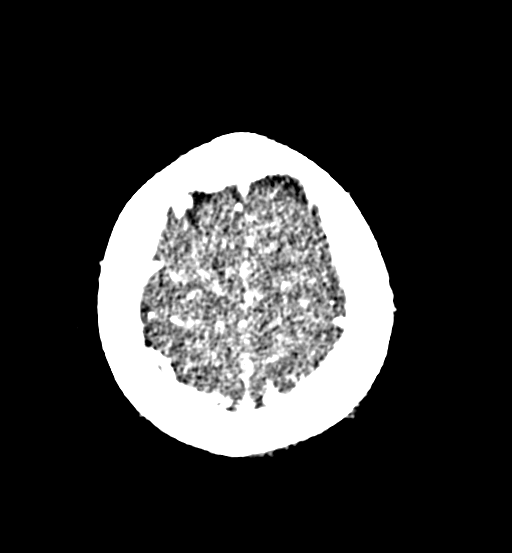
[im 124/144  bone]
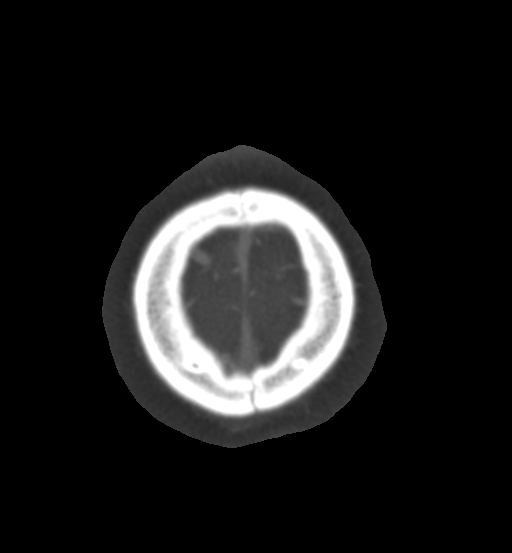
[im 134/144  brain]
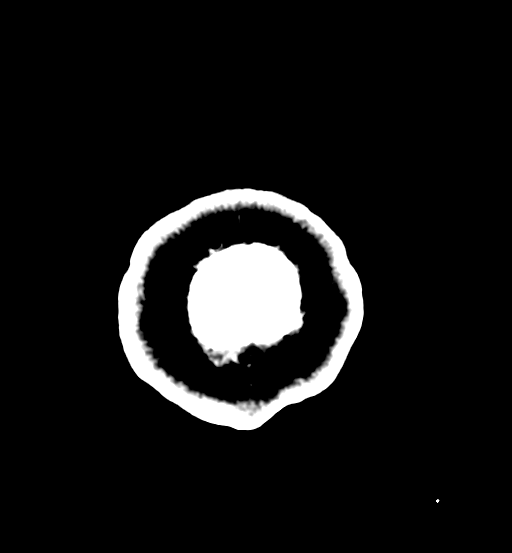

[Series 15: sag thin · sagittal · 0.29mm/px · 3 of 177 slices shown]
[im 45/177  brain]
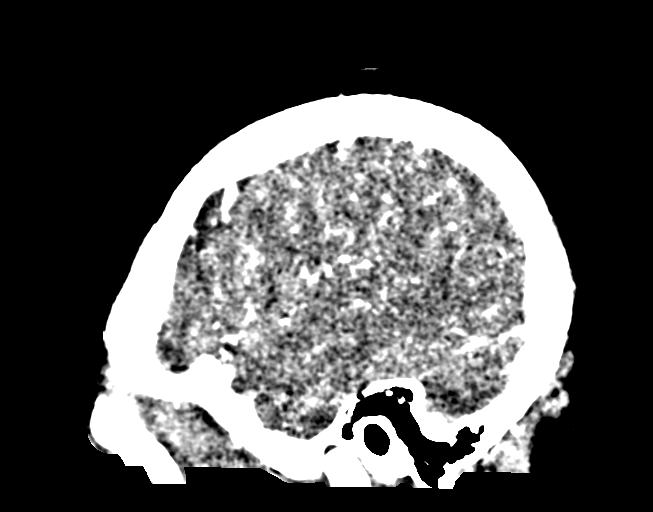
[im 89/177  brain]
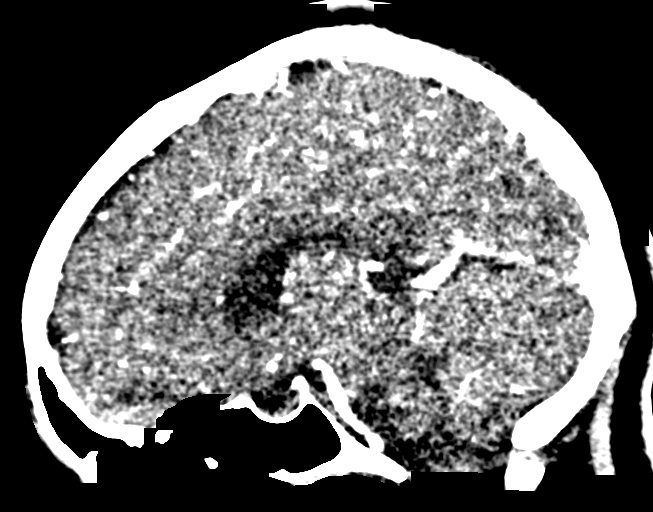
[im 133/177  brain]
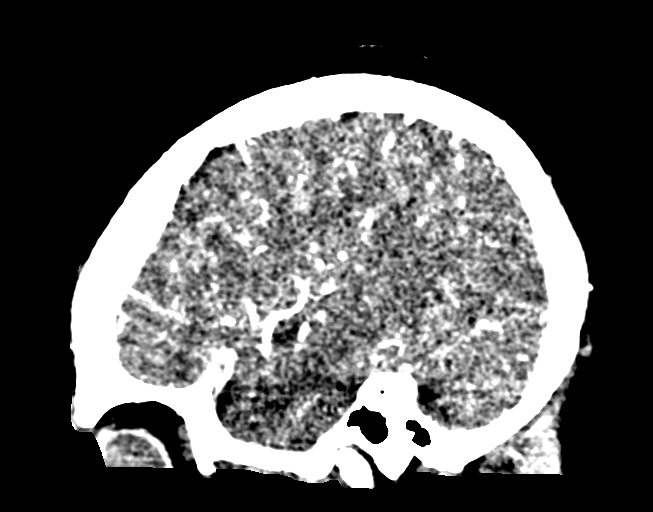

[17 of 47 positions shown; findings below may reference images not displayed]

FINDINGS: CT HEAD

Brain: No evidence of acute infarction, hemorrhage, hydrocephalus,
extra-axial collection or mass lesion/mass effect.

Skull: No acute fracture.

Sinuses: Opacification of a left ethmoid air cell, partially imaged.
Otherwise, clear sinuses.

Orbits: No acute finding.

CTA HEAD

Anterior circulation: No significant stenosis, proximal occlusion,
aneurysm, or vascular malformation. Tiny inferiorly directed left
cavernous ICA infundibula with vessels originating from the tipss.

Posterior circulation: No significant stenosis, proximal occlusion,
aneurysm, or vascular malformation. Non dominant left vertebral
artery with small contribution to the basilar

Venous sinuses: As permitted by contrast timing, patent.
IMPRESSION: 1. No evidence of acute intracranial abnormality.
2. No large vessel occlusion or proximal hemodynamically significant
stenosis.
3. No aneurysm identified.

## 2022-05-18 IMAGING — US US ABDOMEN COMPLETE
2 series · 14 of 25 positions shown · non-contrast
Comparison: None

CLINICAL DATA: Epigastric discomfort, RIGHT upper quadrant pain,
and shortness of breath for 6 months, nausea

EXAM:
ABDOMEN ULTRASOUND COMPLETE

[Series 1: us abdomen complete · 0.22mm/px · 13 of 126 slices shown (1 of 2)]
[im 1/126]
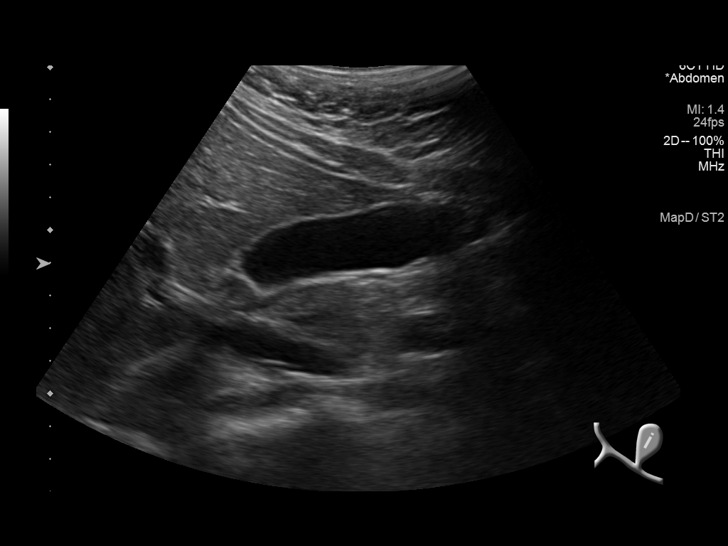
[im 12/126]
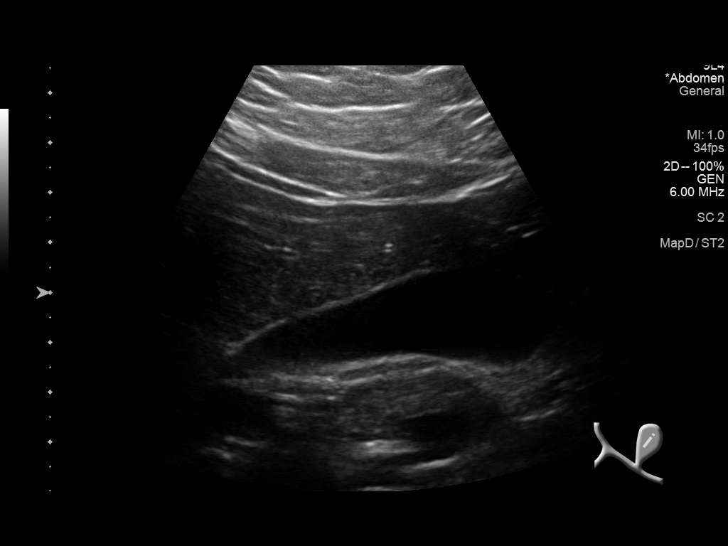
[im 23/126]
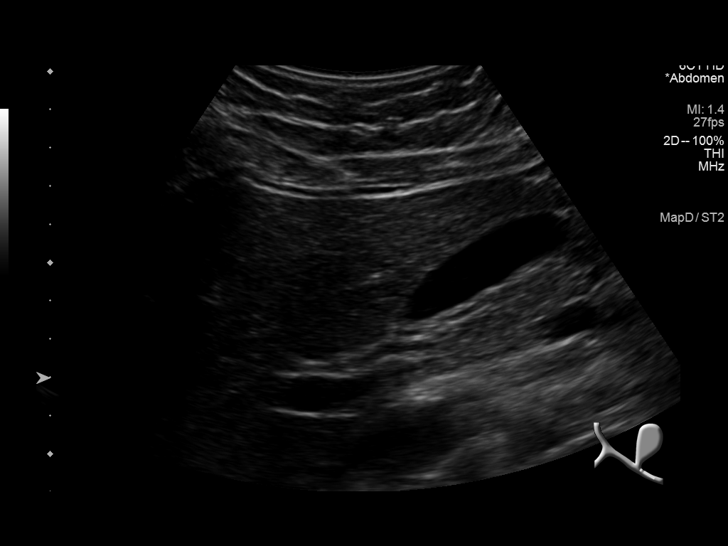
[im 35/126]
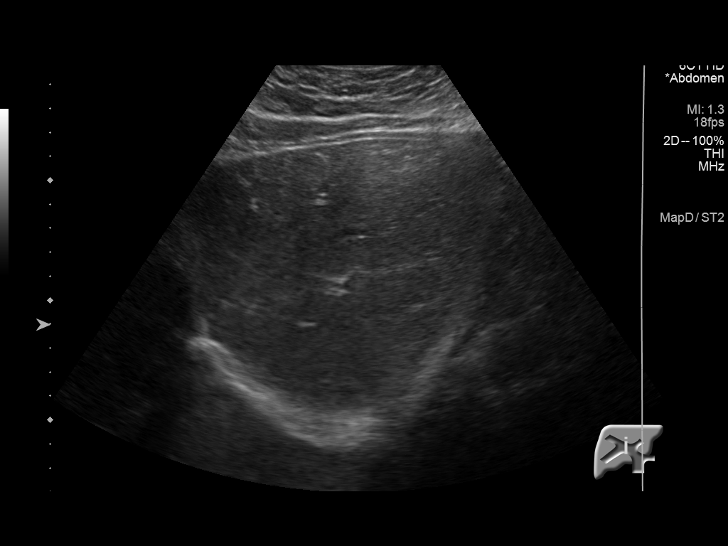
[im 46/126]
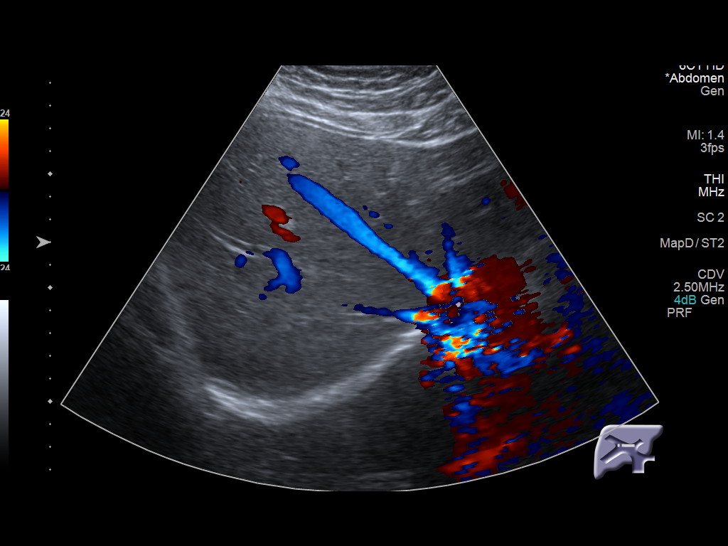
[im 52/126]
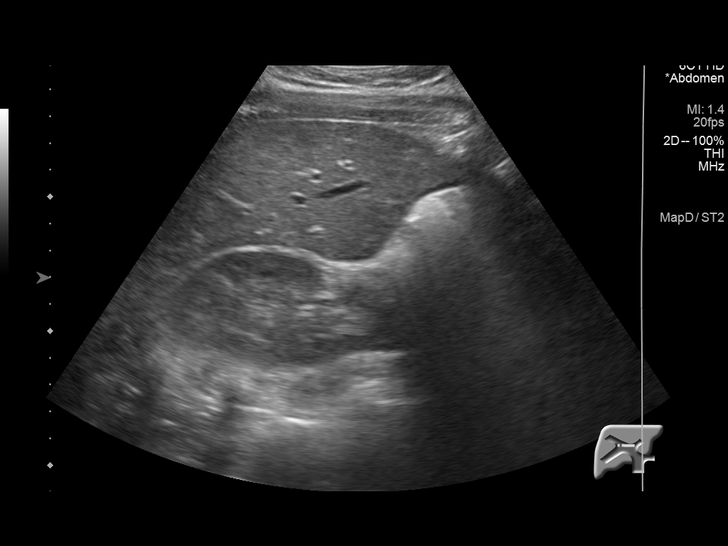
[im 63/126]
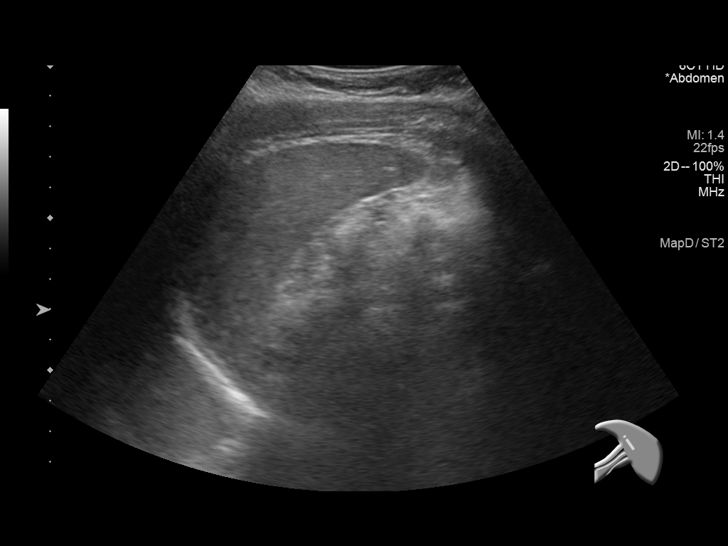
[im 74/126]
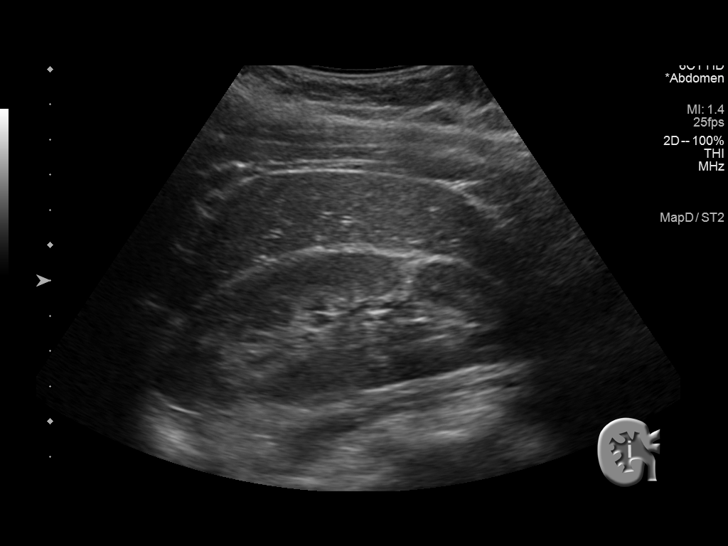
[im 86/126]
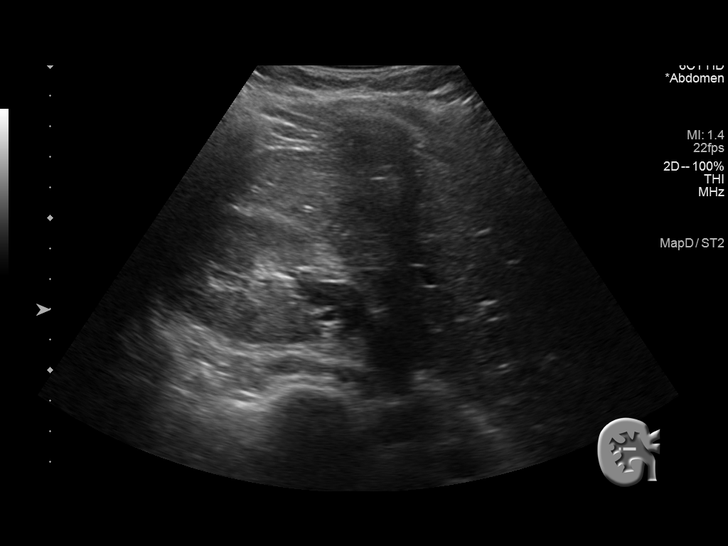
[im 91/126]
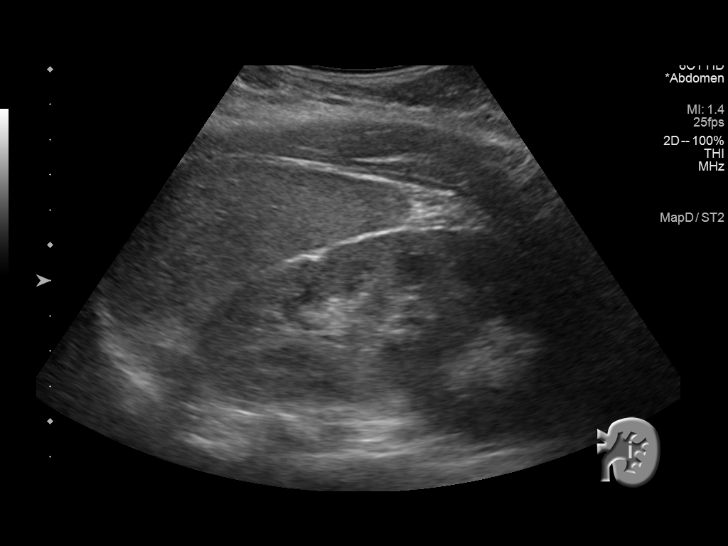
[im 103/126]
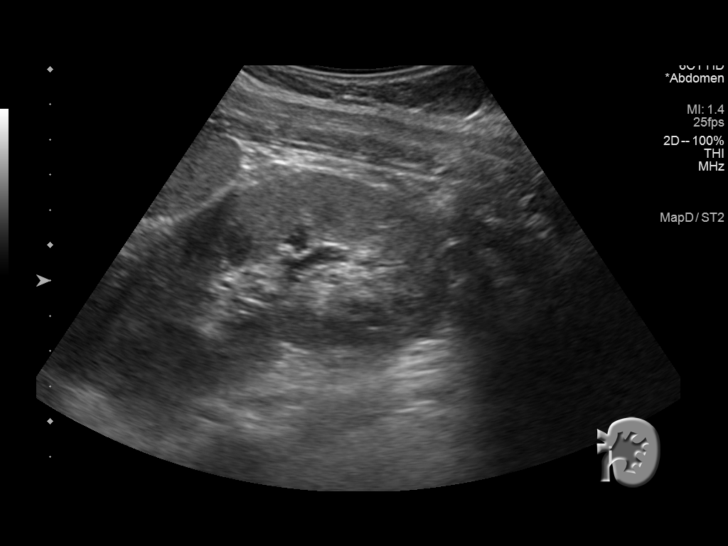
[im 114/126]
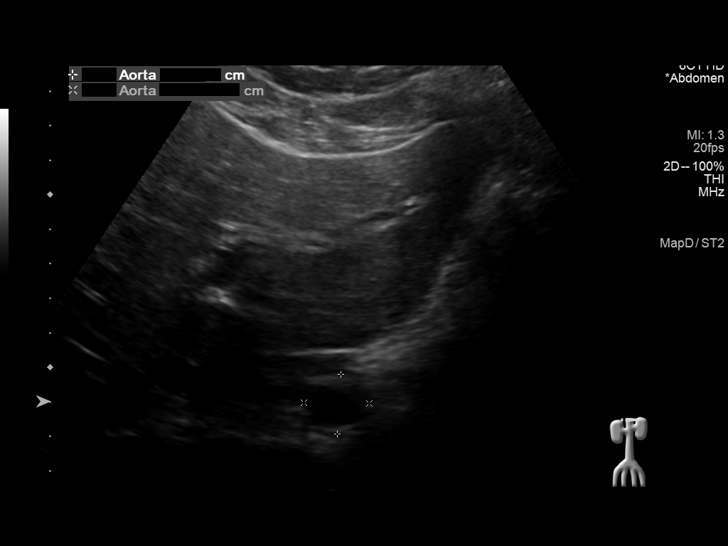
[im 126/126]
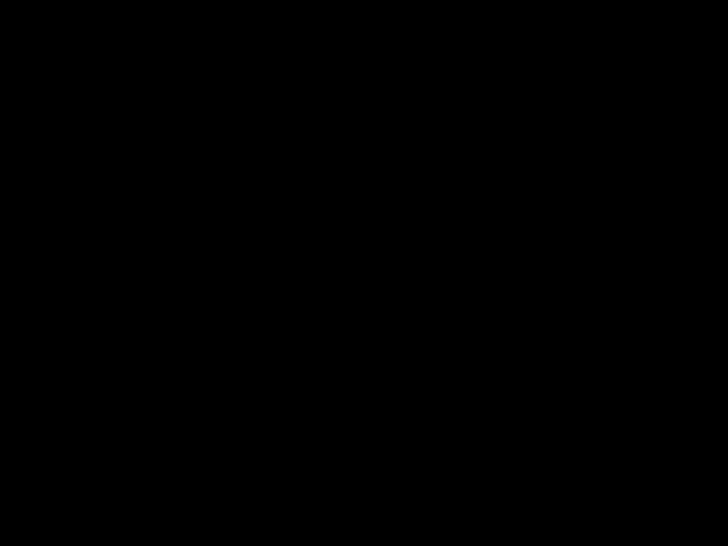

[Series 2001: us abdomen complete · 0.25mm/px · 1 of 13 slices shown (2 of 2)]
[im 13/13]
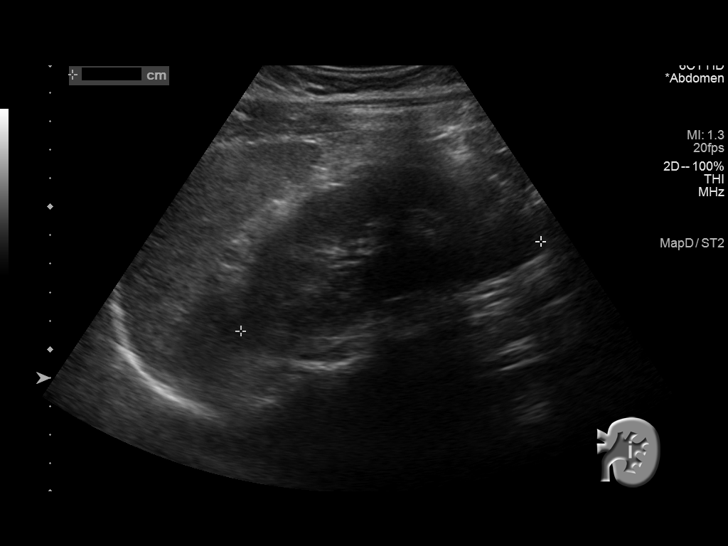

[14 of 25 positions shown; findings below may reference images not displayed]

FINDINGS: Gallbladder: Normally distended without stones or wall thickening.
No pericholecystic fluid or sonographic Murphy sign.

Common bile duct: Diameter: 4 mm, normal

Liver: Normal echogenicity without mass or nodularity. No
intrahepatic biliary dilatation. Portal vein is patent on color
Doppler imaging with normal direction of blood flow towards the
liver.

IVC: Normal appearance

Pancreas: Normal appearance

Spleen: Normal appearance, 8.2 cm length

Right Kidney: Length: 11.3 cm. Normal morphology without mass or
hydronephrosis.

Left Kidney: Length: 11.4 cm. Normal morphology without mass or
hydronephrosis.

Abdominal aorta: Normal caliber

Other findings: No free fluid
IMPRESSION: Normal exam.

## 2022-05-30 ENCOUNTER — Telehealth: Payer: Self-pay | Admitting: Physician Assistant

## 2022-05-30 NOTE — Telephone Encounter (Signed)
Left message for patient to call back and schedule Medicare Annual Wellness Visit (AWV) in office.   If not able to come in office, please offer to do virtually or by telephone.  Left office number and my jabber [phone].  AWVI eligible as of 01/08/2021  Please schedule at anytime with Nurse Health Advisor.
